# Patient Record
Sex: Female | Born: 1945 | Race: Black or African American | Hispanic: No | Marital: Married | Smoking: Never smoker
Health system: Southern US, Community
[De-identification: ages and names within clinical notes are randomized; demographics above are authoritative.]

## PROBLEM LIST (undated history)

## (undated) DIAGNOSIS — E119 Type 2 diabetes mellitus without complications: Secondary | ICD-10-CM

## (undated) DIAGNOSIS — I10 Essential (primary) hypertension: Secondary | ICD-10-CM

## (undated) DIAGNOSIS — E079 Disorder of thyroid, unspecified: Secondary | ICD-10-CM

## (undated) DIAGNOSIS — K8020 Calculus of gallbladder without cholecystitis without obstruction: Secondary | ICD-10-CM

## (undated) DIAGNOSIS — Z832 Family history of diseases of the blood and blood-forming organs and certain disorders involving the immune mechanism: Secondary | ICD-10-CM

## (undated) DIAGNOSIS — D126 Benign neoplasm of colon, unspecified: Secondary | ICD-10-CM

## (undated) DIAGNOSIS — M79609 Pain in unspecified limb: Secondary | ICD-10-CM

## (undated) DIAGNOSIS — R1310 Dysphagia, unspecified: Secondary | ICD-10-CM

## (undated) DIAGNOSIS — G4700 Insomnia, unspecified: Secondary | ICD-10-CM

## (undated) DIAGNOSIS — E039 Hypothyroidism, unspecified: Secondary | ICD-10-CM

## (undated) DIAGNOSIS — M5430 Sciatica, unspecified side: Secondary | ICD-10-CM

## (undated) HISTORY — DX: Benign neoplasm of colon, unspecified: D12.6

## (undated) HISTORY — DX: Pain in unspecified limb: M79.609

## (undated) HISTORY — DX: Insomnia, unspecified: G47.00

## (undated) HISTORY — PX: TUBAL LIGATION: SHX77

## (undated) HISTORY — DX: Disorder of thyroid, unspecified: E07.9

## (undated) HISTORY — DX: Dysphagia, unspecified: R13.10

## (undated) HISTORY — PX: CHOLECYSTECTOMY: SHX55

## (undated) HISTORY — DX: Sciatica, unspecified side: M54.30

## (undated) HISTORY — DX: Family history of diseases of the blood and blood-forming organs and certain disorders involving the immune mechanism: Z83.2

## (undated) HISTORY — DX: Essential (primary) hypertension: I10

## (undated) HISTORY — PX: COLONOSCOPY: SHX174

---

## 2007-11-24 ENCOUNTER — Encounter: Payer: Self-pay | Admitting: Endocrinology

## 2008-12-02 ENCOUNTER — Encounter: Payer: Self-pay | Admitting: Endocrinology

## 2008-12-06 ENCOUNTER — Encounter: Payer: Self-pay | Admitting: Endocrinology

## 2009-03-09 ENCOUNTER — Encounter: Payer: Self-pay | Admitting: Endocrinology

## 2009-03-11 DIAGNOSIS — R1011 Right upper quadrant pain: Secondary | ICD-10-CM | POA: Insufficient documentation

## 2009-03-11 DIAGNOSIS — J309 Allergic rhinitis, unspecified: Secondary | ICD-10-CM | POA: Insufficient documentation

## 2009-03-11 DIAGNOSIS — E042 Nontoxic multinodular goiter: Secondary | ICD-10-CM | POA: Insufficient documentation

## 2009-03-11 DIAGNOSIS — N3000 Acute cystitis without hematuria: Secondary | ICD-10-CM | POA: Insufficient documentation

## 2009-03-11 DIAGNOSIS — K5730 Diverticulosis of large intestine without perforation or abscess without bleeding: Secondary | ICD-10-CM | POA: Insufficient documentation

## 2009-03-11 DIAGNOSIS — E785 Hyperlipidemia, unspecified: Secondary | ICD-10-CM | POA: Insufficient documentation

## 2009-03-14 ENCOUNTER — Ambulatory Visit: Payer: Self-pay | Admitting: Endocrinology

## 2009-03-14 DIAGNOSIS — E890 Postprocedural hypothyroidism: Secondary | ICD-10-CM | POA: Insufficient documentation

## 2009-03-14 DIAGNOSIS — E119 Type 2 diabetes mellitus without complications: Secondary | ICD-10-CM | POA: Insufficient documentation

## 2009-03-14 DIAGNOSIS — R7309 Other abnormal glucose: Secondary | ICD-10-CM | POA: Insufficient documentation

## 2009-03-14 LAB — CONVERTED CEMR LAB: Hgb A1c MFr Bld: 7.2 % — ABNORMAL HIGH (ref 4.6–6.5)

## 2009-03-18 ENCOUNTER — Telehealth (INDEPENDENT_AMBULATORY_CARE_PROVIDER_SITE_OTHER): Payer: Self-pay | Admitting: *Deleted

## 2009-03-28 ENCOUNTER — Encounter: Admission: RE | Admit: 2009-03-28 | Discharge: 2009-03-28 | Payer: Self-pay | Admitting: Endocrinology

## 2010-02-15 LAB — CONVERTED CEMR LAB

## 2010-02-16 ENCOUNTER — Telehealth: Payer: Self-pay | Admitting: Endocrinology

## 2010-02-16 ENCOUNTER — Ambulatory Visit: Payer: Self-pay | Admitting: Endocrinology

## 2010-02-16 DIAGNOSIS — R519 Headache, unspecified: Secondary | ICD-10-CM | POA: Insufficient documentation

## 2010-02-16 DIAGNOSIS — R51 Headache: Secondary | ICD-10-CM | POA: Insufficient documentation

## 2010-02-16 LAB — CONVERTED CEMR LAB
Hgb A1c MFr Bld: 6.4 % (ref 4.6–6.5)
Sed Rate: 64 mm/hr — ABNORMAL HIGH (ref 0–22)
TSH: 1.65 microintl units/mL (ref 0.35–5.50)

## 2011-01-30 NOTE — Progress Notes (Signed)
  Phone Note Call from Patient Call back at Home Phone [phone]   Caller: Patient/[phone] Call For: dr ellsion Summary of Call: per Narda Bonds call stated Dr Lorane Gell stated she can have a rx for metformin , would like that rx.[facility] KitchenMar[facility] KitchenMar[id number] ([facility]) [address], [place], [place]  [id number] Ph: [phone] Fax: [phone] Initial call taken by: Shelbie Proctor,  March 18, 2009 9:41 AM  Follow-up for Phone Call        sent Follow-up by: Minus Breeding MD,  March 18, 2009 12:44 PM  Additional Follow-up for Phone Call Additional follow up Details #1::        called pt to inform pt was informed and made aware Additional Follow-up by: Shelbie Proctor,  March 18, 2009 1:19 PM    New/Updated Medications: METFORMIN HCL 500 MG XR24H-TAB (METFORMIN HCL) 1 qam   Prescriptions: METFORMIN HCL 500 MG XR24H-TAB (METFORMIN HCL) 1 qam  #30 x 11   Entered and Authorized by:   Minus Breeding MD   Signed by:   Minus Breeding MD on 03/18/2009   Method used:   Electronically to        [facility]. #[id number]* (retail)       [address] [place], [place]  [id number]       Ph: [phone]       Fax: [phone]   RxID:   [id number]

## 2011-01-30 NOTE — Letter (Signed)
Summary: [facility]   Imported By: Sherian Rein 03/21/2009 10:31:06  _____________________________________________________________________  External Attachment:    Type:   Image     Comment:   External Document

## 2011-01-30 NOTE — Progress Notes (Signed)
----   Converted from flag ---- ---- 02/16/2010 3:07 PM, Irma Newness wrote: Patient needs a prescription of the Metformin sent to [facility] in [place], [place] on [facility]. ------------------------------  Prescriptions: METFORMIN HCL 500 MG XR24H-TAB (METFORMIN HCL) 1 qam  #30 x 11   Entered by:   Margaret Pyle, CMA   Authorized by:   Minus Breeding MD   Signed by:   Margaret Pyle, CMA on 02/16/2010   Method used:   Electronically to        [facility]. #[id number]* (retail)       [address], [place], [place]  [id number]       Ph: [phone]       Fax: [phone]   RxID:   [id number]

## 2011-01-30 NOTE — Assessment & Plan Note (Signed)
Summary: NEW ENDO/ANTHEM/PAPER WORK/$50/PN   Vital Signs:  Patient profile:   65 year old female Height:      65 inches (165.10 cm) Weight:      213.6 pounds (97.09 kg) BMI:     35.67 O2 Sat:      96 % Temp:     98.3 degrees F (36.83 degrees C) oral Pulse rate:   69 / minute BP sitting:   120 / 78  (left arm) Cuff size:   large  Vitals Entered By: Orlan Leavens (March 14, 2009 3:54 PM)  Referring Provider:  Dr. Criss Alvine Primary Provider:  Dr. Lajoyce Lauber   History of Present Illness: pt had i-131 rx for hyperthyroidism approx 10 years ago.  she has been euthyroid on synthroid 100 micrograms once daily. pt was noted to have hyperglycemia on routine labs.  she does not check cbg at home.  she says her diet and exercise are poor. symptomatically, she has few years of slight blurry vision.  Current Medications (verified): 1)  Omega-3 350 Mg Caps (Omega-3 Fatty Acids) .... Take 1 By Mouth Qd 2)  Effexor Xr 75 Mg Xr24h-Cap (Venlafaxine Hcl) .... Take 1 By Mouth Qd 3)  Doxycycline Hyclate 50 Mg Caps (Doxycycline Hyclate) .... Take 1 Two Times A Day 4)  Olux-E 0.05 % Foam (Clobetasol Propionate Emulsion) .... Apply To Scalp Two Times A Day 5)  Synthroid 100 Mcg Tabs (Levothyroxine Sodium) .... Take 1 By Mouth Qd  Allergies (verified): No Known Drug Allergies  Past History:  Past Medical History:    Goiter-Multinodular    Hypothyroidism    Allergic rhinitis    Diverticulosis, colon    Hyperlipidemia     (03/11/2009)  Family History:    mother has uncertain type of thyroid dz    no dm.    sister has sjogren's syndrome.  Social History:    Reviewed history and no changes required:       married       retired Warehouse manager  Review of Systems       denies weight loss, double vision, headache, chest pain, sob, n/v, urinary frequency, cramps, memory loss, hypoglycemia, bruising, and numbness.  she attributes excessivre diaphoresis to menopause.  effexor helps  depression.   Physical Exam  General:  obese.   Head:  head: no deformity eyes: no periorbital swelling, no proptosis external nose and ears are normal mouth: no lesion seen  Neck:  uncertain if i can appreciate any thyroid enlargement or not.  surface is irregular. Lungs:  Clear to auscultation bilaterally. Normal respiratory effort.  Heart:  Regular rate and rhythm without murmurs or gallops noted. Normal S1,S2.   Abdomen:  abdomen is soft, nontender.  no hepatosplenomegaly.   not distended.  no hernia  Msk:  Normal muscle tone and bulk.  Normal posture, no vertebral or CVA tenderness.  No joint swelling.  Extremities:  no deformity.  no ulcer on the feet.  feet are of normal color and temp.  no edema  Neurologic:  cn 2-12 grossly intact.   readily moves all 4's.   sensation is intact to touch on the feet  Skin:  normal texture and temp.  no rash.  not diaphoretic  Cervical Nodes:  No significant adenopathy.  Psych:  Alert and cooperative; normal mood and affect; normal attention span and concentration.   Additional Exam:  outside test results are reviewed:  ogtt, 75 grams fasting=155 1 hr=278 2 hr=248  tsh=0.54  today: HbA1C                [  H]  7.2 %     Impression & Recommendations:  Problem # 1:  GOITER-MULTINODULAR (ICD-241.1) Assessment Unchanged  Problem # 2:  HYPOTHYROIDISM, POST-RADIATION (ICD-244.1) well-replaced  Problem # 3:  HYPERGLYCEMIA (ICD-790.29) Assessment: New  Problem # 4:  blurry vision uncertain etiology  Other Orders: Radiology Referral (Radiology) TLB-A1C / Hgb A1C (Glycohemoglobin) (83036-A1C)   Patient Instructions: 1)  cc dr cousins 2)  we discussed thyroid US.  it would not change rx now, but would tell us what to expect in the future. if this is a multinodular goiter, recurrent endogenous hyperthyroidism would probably redevelop over a period of many years.  this would first be manifested as a reduction of her synthroid  dosage requirement.  pt requests the us--it is ordered 3)  we discussed the importance of diet and exercise therapy and the risk of diabetes 4)  same synthroid for now (100 micrograms/day) 5)  i advised metformin 6)  i'll leave eval of blurry vision to dr Criss Alvine, if he needs any eval 7)  ret 3 mos      Preventive Care Screening     Last colonoscopy done in 2005 @ [address] in [place], [place]

## 2011-01-30 NOTE — Assessment & Plan Note (Signed)
Summary: FU ON DM /NWS  #   Vital Signs:  Patient profile:   65 year old female Height:      65 inches (165.10 cm) Weight:      200.25 pounds (91.02 kg) BMI:     33.44 O2 Sat:      97 % on Room air Temp:     97.0 degrees F (36.11 degrees C) oral Pulse rate:   58 / minute BP sitting:   120 / 78  (left arm) Cuff size:   large  Vitals Entered By: Josph Macho RMA (February 16, 2010 1:36 PM)  O2 Flow:  Room air CC: Follow-up visit/ pt states she is no longer taking Effexor, Doxycycline, or Olux/ CF   Referring Provider:  Dr. Criss Alvine Primary Provider:  Dr. Lajoyce Lauber  CC:  Follow-up visit/ pt states she is no longer taking Effexor, Doxycycline, and or Olux/ CF.  History of Present Illness: no cbg record, but states cbg's vary from 107-120, in am.   she does notice the goiter.   she reports intermittent swallowing problems in the neck.   pt also states an intermittent right parietal headache  Allergies: No Known Drug Allergies  Past History:  Past Medical History: Last updated: 03/11/2009 Goiter-Multinodular Hypothyroidism Allergic rhinitis Diverticulosis, colon Hyperlipidemia  Review of Systems  The patient denies dyspnea on exertion.         denies neck pain.  Physical Exam  General:  normal appearance.   Head:  right parietal area is normal--nontender Neck:  i do not notice a goiter Pulses:  dorsalis pedis intact bilat.   Extremities:  no deformity.  no ulcer on the feet.  feet are of normal color and temp.  no edema  Neurologic:  sensation is intact to touch on the feet  Additional Exam:  FastTSH                   1.65 uIU/mL                 0.35-5.50 Hemoglobin A1C            6.4 %                       4.6-6.5 Sed Rate             [H]  64 mm/hr     Impression & Recommendations:  Problem # 1:  HYPOTHYROIDISM, POST-RADIATION (ICD-244.1) well-replaced  Problem # 2:  DM (ICD-250.00) well-controlled  Problem # 3:  GOITER-MULTINODULAR  (ICD-241.1) too small to cause dysphagia  Problem # 4:  HEADACHE (ICD-784.0) uncertain etiology should be eval in view of elevated esr.  Other Orders: TLB-TSH (Thyroid Stimulating Hormone) (84443-TSH) TLB-A1C / Hgb A1C (Glycohemoglobin) (83036-A1C) TLB-Sedimentation Rate (ESR) (85652-ESR) Est. Patient Level IV (01751)  Patient Instructions: 1)  tests are being ordered for you today.  a few days after the test(s), please call [phone] to hear your test results. 2)  pending the test results, please continue the same medications for now. 3)  you should consider having the swallowing and headache checked as separate problems. 4)  (update: i left message on phone-tree:  see dr Criss Alvine to f/u headache, in view of elevated esr)  Preventive Care Screening  Hemoccult:    Date:  02/15/2010    Results:  historical   Pap Smear:    Date:  02/15/2010    Results:  historical   Mammogram:    Date:  01/31/2010    Results:  historical   Last Flu Shot:    Date:  09/30/2009    Results:  historical

## 2012-01-28 ENCOUNTER — Other Ambulatory Visit: Payer: Self-pay | Admitting: Family Medicine

## 2012-01-28 ENCOUNTER — Ambulatory Visit
Admission: RE | Admit: 2012-01-28 | Discharge: 2012-01-28 | Disposition: A | Discharge status: Home / Self Care | Payer: BC Managed Care – PPO | Source: Ambulatory Visit | Attending: Family Medicine | Admitting: Family Medicine

## 2012-01-28 DIAGNOSIS — R1011 Right upper quadrant pain: Secondary | ICD-10-CM

## 2012-01-29 ENCOUNTER — Other Ambulatory Visit: Payer: Self-pay

## 2012-02-04 ENCOUNTER — Encounter (INDEPENDENT_AMBULATORY_CARE_PROVIDER_SITE_OTHER): Payer: Self-pay | Admitting: Surgery

## 2012-02-19 ENCOUNTER — Ambulatory Visit (INDEPENDENT_AMBULATORY_CARE_PROVIDER_SITE_OTHER): Payer: BC Managed Care – PPO | Admitting: Surgery

## 2012-02-19 ENCOUNTER — Encounter (INDEPENDENT_AMBULATORY_CARE_PROVIDER_SITE_OTHER): Payer: Self-pay | Admitting: Surgery

## 2012-02-19 VITALS — BP 118/74 | HR 56 | Temp 97.5°F | Ht 65.0 in | Wt 194.4 lb

## 2012-02-19 DIAGNOSIS — K8020 Calculus of gallbladder without cholecystitis without obstruction: Secondary | ICD-10-CM | POA: Insufficient documentation

## 2012-02-19 NOTE — Progress Notes (Signed)
Patient ID: Kristy Fuller, female   DOB: 1946/06/12, 66 y.o.   MRN: [id number]  Chief Complaint  Patient presents with  . Other    symptomatic cholelithiasis    HPI Kristy Fuller is a 65 y.o. female.   HPIThis is a pleasant female referred by Dr. Warrick Parisian for evaluation of right upper quadrant abdominal pain. The patient reports that she has had right upper quadrant abdominal pain which she described as sharp for many years. It is now getting worse and happens almost daily. It does not refer any where else. She has no nausea or vomiting. The pain is moderate in intensity. She cannot relate it to anything she eats. She denies any jaundice. She has no other complaints.  Past Medical History  Diagnosis Date  . Diabetes mellitus   . Thyroid disease   . Hypertension   . Benign neoplasm of colon   . Insomnia, unspecified   . Pain in limb   . Dysphagia, unspecified   . Sciatica     History reviewed. No pertinent past surgical history.  History reviewed. No pertinent family history.  Social History History  Substance Use Topics  . Smoking status: Never Smoker   . Smokeless tobacco: Not on file  . Alcohol Use: No    No Known Allergies  Current Outpatient Prescriptions  Medication Sig Dispense Refill  . aspirin 81 MG tablet Take 81 mg by mouth daily.      . Biotin 5000 MCG CAPS Take 5,000 mcg by mouth.      . calcium & magnesium carbonates (MYLANTA) 311-232 MG per tablet Take 1 tablet by mouth 2 (two) times daily.      . Cholecalciferol (D-3-5) 5000 UNITS capsule Take 10,000 Units by mouth daily.      Marland Kitchen co-enzyme Q-10 30 MG capsule Take 200 mg by mouth daily.      . fish oil-omega-3 fatty acids 1000 MG capsule Take 2 g by mouth daily.      Marland Kitchen glucosamine-chondroitin 500-400 MG tablet Take 1 tablet by mouth 3 (three) times daily.      Boris Lown Oil 500 MG CAPS Take 500 mg by mouth daily.      Marland Kitchen levothyroxine (SYNTHROID, LEVOTHROID) 100 MCG tablet Take by mouth daily.       Marland Kitchen losartan (COZAAR) 50 MG tablet Take 50 mg by mouth daily.      . metFORMIN (GLUCOPHAGE-XR) 750 MG 24 hr tablet Take 750 mg by mouth daily with breakfast.      . NON FORMULARY 400 mg daily. Tumeric      . vitamin B-12 (CYANOCOBALAMIN) 1000 MCG tablet Take 1,000 mcg by mouth daily.        Review of Systems Review of Systems  Constitutional: Negative for fever, chills and unexpected weight change.  HENT: Negative for hearing loss, congestion, sore throat, trouble swallowing and voice change.   Eyes: Negative for visual disturbance.  Respiratory: Negative for cough and wheezing.   Cardiovascular: Negative for chest pain, palpitations and leg swelling.  Gastrointestinal: Positive for abdominal pain. Negative for nausea, vomiting, diarrhea, constipation, blood in stool, abdominal distention and anal bleeding.  Genitourinary: Negative for hematuria, vaginal bleeding and difficulty urinating.  Musculoskeletal: Negative for arthralgias.  Skin: Negative for rash and wound.  Neurological: Negative for seizures, syncope and headaches.  Hematological: Negative for adenopathy. Does not bruise/bleed easily.  Psychiatric/Behavioral: Negative for confusion.    Blood pressure 118/74, pulse 56, temperature 97.5 F (36.4 C), temperature  source Temporal, height 5\' 5"  (1.651 m), weight 194 lb 6.4 oz (88.179 kg), SpO2 97.00%.  Physical Exam Physical Exam  Constitutional: She is oriented to person, place, and time. She appears well-developed and well-nourished. No distress.  HENT:  Head: Normocephalic and atraumatic.  Right Ear: External ear normal.  Left Ear: External ear normal.  Nose: Nose normal.  Mouth/Throat: Oropharynx is clear and moist. No oropharyngeal exudate.  Eyes: Conjunctivae are normal. Pupils are equal, round, and reactive to light. Right eye exhibits no discharge. Left eye exhibits no discharge. No scleral icterus.  Neck: Normal range of motion. No tracheal deviation present. No  thyromegaly present.  Cardiovascular: Normal rate, regular rhythm, normal heart sounds and intact distal pulses.   No murmur heard. Pulmonary/Chest: Effort normal and breath sounds normal. No respiratory distress. She has no wheezes. She has no rales.  Abdominal: Soft. Bowel sounds are normal. She exhibits no distension. There is no tenderness. There is no rebound and no guarding.  Musculoskeletal: Normal range of motion. She exhibits no edema and no tenderness.  Lymphadenopathy:    She has no cervical adenopathy.  Neurological: She is alert and oriented to person, place, and time.  Skin: Skin is warm and dry. No rash noted. She is not diaphoretic. No erythema.  Psychiatric: Her behavior is normal. Judgment normal.    Data Reviewed I have the notes from Dr. Creta Levin office which I have reviewed. Her liver function tests are normal. Her ultrasound shows cholelithiasis. There is no gallbladder wall thickening and the bile duct is normal  Assessment    Symptomatic cholelithiasis    Plan   I do believe the pain in her right upper quadrant is gallbladder related. I recommend laparoscopic cholecystectomy with possible cholangiogram. I gave her literature regarding this and discussed the surgery with her and her husband. I discussed the risk which includes but is not limited to bleeding, infection, bile duct injury, bile leak, injury to other structures, need to convert to an open procedure, the chances may not resolve all her symptoms, etc. She is going to think about it and call back whether or not her she will to schedule surgery .       Yoshua Geisinger A 02/19/2012, 9:52 AM

## 2012-02-26 ENCOUNTER — Telehealth (INDEPENDENT_AMBULATORY_CARE_PROVIDER_SITE_OTHER): Payer: Self-pay | Admitting: General Surgery

## 2012-02-26 ENCOUNTER — Other Ambulatory Visit (INDEPENDENT_AMBULATORY_CARE_PROVIDER_SITE_OTHER): Payer: Self-pay | Admitting: Surgery

## 2012-02-26 DIAGNOSIS — D689 Coagulation defect, unspecified: Secondary | ICD-10-CM

## 2012-02-26 NOTE — Telephone Encounter (Signed)
Called pt to let her know that I sent a referral to the Hematologists and that they will be giving her a call

## 2012-03-04 ENCOUNTER — Telehealth: Payer: Self-pay | Admitting: Family Medicine

## 2012-03-04 NOTE — Telephone Encounter (Signed)
Called pt, left message, for pt to call us back

## 2012-03-06 ENCOUNTER — Telehealth: Payer: Self-pay | Admitting: Oncology

## 2012-03-06 NOTE — Telephone Encounter (Signed)
Talked to pt's husband,gave him appt date , time and location

## 2012-03-10 ENCOUNTER — Telehealth: Payer: Self-pay | Admitting: Oncology

## 2012-03-10 NOTE — Telephone Encounter (Signed)
Referred by Dr. Magnus Ivan Dx- R/O Clotting Disorder

## 2012-03-17 ENCOUNTER — Encounter: Payer: Self-pay | Admitting: Oncology

## 2012-03-17 DIAGNOSIS — Z832 Family history of diseases of the blood and blood-forming organs and certain disorders involving the immune mechanism: Secondary | ICD-10-CM

## 2012-03-17 HISTORY — DX: Family history of diseases of the blood and blood-forming organs and certain disorders involving the immune mechanism: Z83.2

## 2012-03-19 ENCOUNTER — Other Ambulatory Visit (HOSPITAL_BASED_OUTPATIENT_CLINIC_OR_DEPARTMENT_OTHER): Payer: BC Managed Care – PPO | Admitting: Lab

## 2012-03-19 ENCOUNTER — Other Ambulatory Visit: Payer: Self-pay | Admitting: Oncology

## 2012-03-19 ENCOUNTER — Encounter (HOSPITAL_COMMUNITY)
Admission: RE | Admit: 2012-03-19 | Discharge: 2012-03-19 | Disposition: A | Discharge status: Home / Self Care | Payer: BC Managed Care – PPO | Source: Ambulatory Visit | Attending: Oncology | Admitting: Oncology

## 2012-03-19 ENCOUNTER — Ambulatory Visit (HOSPITAL_BASED_OUTPATIENT_CLINIC_OR_DEPARTMENT_OTHER): Payer: BC Managed Care – PPO | Admitting: Oncology

## 2012-03-19 ENCOUNTER — Ambulatory Visit: Payer: BC Managed Care – PPO

## 2012-03-19 VITALS — BP 130/68 | HR 48 | Temp 97.8°F | Ht 65.25 in | Wt 194.8 lb

## 2012-03-19 DIAGNOSIS — Z832 Family history of diseases of the blood and blood-forming organs and certain disorders involving the immune mechanism: Secondary | ICD-10-CM

## 2012-03-19 DIAGNOSIS — K8020 Calculus of gallbladder without cholecystitis without obstruction: Secondary | ICD-10-CM

## 2012-03-19 DIAGNOSIS — D649 Anemia, unspecified: Secondary | ICD-10-CM | POA: Insufficient documentation

## 2012-03-19 LAB — CBC WITH DIFFERENTIAL/PLATELET
BASO%: 0.4 % (ref 0.0–2.0)
Basophils Absolute: 0 10*3/uL (ref 0.0–0.1)
EOS%: 1.5 % (ref 0.0–7.0)
Eosinophils Absolute: 0.1 10*3/uL (ref 0.0–0.5)
HCT: 35.8 % (ref 34.8–46.6)
HGB: 12.2 g/dL (ref 11.6–15.9)
LYMPH%: 25.6 % (ref 14.0–49.7)
MCH: 31.1 pg (ref 25.1–34.0)
MCHC: 34.1 g/dL (ref 31.5–36.0)
MCV: 91.3 fL (ref 79.5–101.0)
MONO#: 0.4 10*3/uL (ref 0.1–0.9)
MONO%: 7.3 % (ref 0.0–14.0)
NEUT#: 4 10*3/uL (ref 1.5–6.5)
NEUT%: 65.2 % (ref 38.4–76.8)
Platelets: 198 10*3/uL (ref 145–400)
RBC: 3.93 10*6/uL (ref 3.70–5.45)
RDW: 14.3 % (ref 11.2–14.5)
WBC: 6.1 10*3/uL (ref 3.9–10.3)
lymph#: 1.6 10*3/uL (ref 0.9–3.3)

## 2012-03-19 LAB — MORPHOLOGY
PLT EST: ADEQUATE
RBC Comments: NORMAL

## 2012-03-19 LAB — APTT: aPTT: 37 seconds (ref 24–37)

## 2012-03-19 LAB — CHCC SMEAR

## 2012-03-19 LAB — PROTHROMBIN TIME
INR: 0.96 (ref <1.50)
Prothrombin Time: 13 seconds (ref 11.6–15.2)

## 2012-03-19 LAB — ABO/RH: ABO/RH(D): O NEG

## 2012-03-19 LAB — HOLD TUBE, BLOOD BANK

## 2012-03-19 NOTE — Progress Notes (Signed)
New Patient Hematology-Oncology Evaluation   Kristy Fuller [id number] 1946-03-29 66 y.o. 03/19/2012  CC: Dr. Abigail Miyamoto; Dr. Warrick Parisian [facility] in [place]   Reason for referral: Possible family history of a clotting disorder in a woman who is being considered for gallbladder surgery   HPI:  This is a pleasant 66 year old woman with hypertension over the last 2 years, type 2 diabetes on oral agent for the last 3 years, and hypothyroid on replacement for over 15 years. She has never had any prior surgical procedures. She has had a 5-6 month history of right upper quadrant fullness with intermittent low level pain and intolerance to certain foods particularly nuts and bread but not fried foods. She gets a feeling like there is a knot in the right upper quadrant area. She has not noticed any acholic stools or dark urine. She has normal liver functions. Ultrasound of the gallbladder showed gallstones, wall thickening, no evidence for obstruction.  Her daughter who is now 25 years old was evaluated in the past for recurrent miscarriage and was found to be a homozygote for the MTHFR gene. Although this genetic abnormality  was felt to result in an increased clotting risk in homozygotes, further study shows that it is not a risk factor for either recurrent miscarriage or thrombosis. The patient's daughter and nobody else in the family including her 2 brothers aged 90 and 67, Mrs. Appleby, her 3 sisters and 3 brothers, have ever had problems with blood clots. One sister has lupus and one sister was diagnosed with Sjogren's syndrome. Her mother  has hypothyroidism and her father died of lung cancer related to cigarette smoking.   PMH: Past Medical History  Diagnosis Date  . Diabetes mellitus   . Thyroid disease   . Hypertension   . Benign neoplasm of colon   . Insomnia, unspecified   . Pain in limb   . Dysphagia, unspecified   . Sciatica   . Family history of  bleeding disorder 03/17/2012   no history of hepatitis yellow jaundice or malaria.   past surgical history: No prior surgery  Allergies: No Known Allergies  Medications: Medications Prior to Admission  Medication Sig Dispense Refill  . aspirin 81 MG tablet Take 81 mg by mouth daily.      . Biotin 5000 MCG CAPS Take 5,000 mcg by mouth.      . calcium & magnesium carbonates (MYLANTA) 311-232 MG per tablet Take 1 tablet by mouth 2 (two) times daily.      . Cholecalciferol (D-3-5) 5000 UNITS capsule Take 10,000 Units by mouth daily.      Marland Kitchen co-enzyme Q-10 30 MG capsule Take 200 mg by mouth daily.      . fish oil-omega-3 fatty acids 1000 MG capsule Take 2 g by mouth daily.      Marland Kitchen glucosamine-chondroitin 500-400 MG tablet Take 1 tablet by mouth 3 (three) times daily.      Boris Lown Oil 500 MG CAPS Take 500 mg by mouth daily.      Marland Kitchen levothyroxine (SYNTHROID, LEVOTHROID) 100 MCG tablet Take by mouth daily.      Marland Kitchen losartan (COZAAR) 50 MG tablet Take 50 mg by mouth daily.      . metFORMIN (GLUCOPHAGE-XR) 750 MG 24 hr tablet Take 750 mg by mouth daily with breakfast.      . NON FORMULARY 400 mg daily. Tumeric      . vitamin B-12 (CYANOCOBALAMIN) 1000 MCG tablet Take 1,000 mcg by  mouth daily.       No current facility-administered medications on file as of 03/19/2012.    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  she has never smoked. She does not have any smokeless tobacco history on file. she does not drink alcohol or use illicit drugs. Her husband is a Optician, dispensing and he accompanies her today.  Family History: See above  Review of Systems: Constitutional symptoms: No constitutional symptoms; she has not been running any fevers when she gets flare up of her right upper quadrant symptoms HEENT: Respiratory: No cough or dyspnea Cardiovascular:  No chest pain or palpitation Gastrointestinal ROS: See above Genito-Urinary ROS: No urinary tract  symptoms  Hematological and Lymphatic: Musculoskeletal: No bone pain Neurologic: No headache or change in vision Dermatologic: No skin rash Remaining ROS negative.  Physical Exam: Blood pressure 130/68, pulse 48, temperature 97.8 F (36.6 C), temperature source Oral, height 5' 5.25" (1.657 m), weight 194 lb 12.8 oz (88.361 kg). Wt Readings from Last 3 Encounters:  03/19/12 194 lb 12.8 oz (88.361 kg)  02/19/12 194 lb 6.4 oz (88.179 kg)  02/16/10 200 lb 4 oz (90.833 kg)    General appearance: Well-nourished African American woman Head: Normal Neck: Normal Lymph nodes: No adenopathy Breasts: Not examined Lungs: Clear to auscultation resonant to percussion Heart: Regular rhythm no murmur Abdominal: Soft nontender no mass no organomegaly GU: Not examined Extremities: No edema or calf tenderness Neurologic: Pupils equal round reactive to light optic disc sharp, cranial nerves grossly normal, motor strength 5 over 5, reflexes 1+ symmetric, sensation intact to vibration over the fingertips. 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 No contraindications to surgery with the usual perioperative thromboprophylaxis.  She is apprehensive about rushing into surgery. She would like to see a gastroenterologist to discuss medical therapy to dissolve her gallstones. Her family physician can arrange this.      Levert Feinstein, MD 03/19/2012, 5:06 PM

## 2012-05-08 ENCOUNTER — Emergency Department (HOSPITAL_COMMUNITY)
Admission: EM | Admit: 2012-05-08 | Discharge: 2012-05-08 | Disposition: A | Discharge status: Home / Self Care | Payer: Medicare Other | Attending: Emergency Medicine | Admitting: Emergency Medicine

## 2012-05-08 ENCOUNTER — Encounter (HOSPITAL_COMMUNITY): Payer: Self-pay | Admitting: Emergency Medicine

## 2012-05-08 DIAGNOSIS — E119 Type 2 diabetes mellitus without complications: Secondary | ICD-10-CM | POA: Insufficient documentation

## 2012-05-08 DIAGNOSIS — Z79899 Other long term (current) drug therapy: Secondary | ICD-10-CM | POA: Insufficient documentation

## 2012-05-08 DIAGNOSIS — I10 Essential (primary) hypertension: Secondary | ICD-10-CM | POA: Insufficient documentation

## 2012-05-08 DIAGNOSIS — E039 Hypothyroidism, unspecified: Secondary | ICD-10-CM | POA: Insufficient documentation

## 2012-05-08 DIAGNOSIS — N390 Urinary tract infection, site not specified: Secondary | ICD-10-CM | POA: Insufficient documentation

## 2012-05-08 DIAGNOSIS — E079 Disorder of thyroid, unspecified: Secondary | ICD-10-CM | POA: Insufficient documentation

## 2012-05-08 HISTORY — DX: Hypothyroidism, unspecified: E03.9

## 2012-05-08 HISTORY — DX: Calculus of gallbladder without cholecystitis without obstruction: K80.20

## 2012-05-08 LAB — COMPREHENSIVE METABOLIC PANEL
ALT: 9 U/L (ref 0–35)
AST: 13 U/L (ref 0–37)
Albumin: 4 g/dL (ref 3.5–5.2)
Alkaline Phosphatase: 124 U/L — ABNORMAL HIGH (ref 39–117)
BUN: 10 mg/dL (ref 6–23)
CO2: 26 mEq/L (ref 19–32)
Calcium: 9.6 mg/dL (ref 8.4–10.5)
Chloride: 101 mEq/L (ref 96–112)
Creatinine, Ser: 0.9 mg/dL (ref 0.50–1.10)
GFR calc Af Amer: 77 mL/min — ABNORMAL LOW (ref >90)
GFR calc non Af Amer: 66 mL/min — ABNORMAL LOW (ref >90)
Glucose, Bld: 185 mg/dL — ABNORMAL HIGH (ref 70–99)
Potassium: 3.8 mEq/L (ref 3.5–5.1)
Sodium: 139 mEq/L (ref 135–145)
Total Bilirubin: 0.2 mg/dL — ABNORMAL LOW (ref 0.3–1.2)
Total Protein: 7.7 g/dL (ref 6.0–8.3)

## 2012-05-08 LAB — URINALYSIS, ROUTINE W REFLEX MICROSCOPIC
Bilirubin Urine: NEGATIVE
Glucose, UA: NEGATIVE mg/dL
Ketones, ur: NEGATIVE mg/dL
Nitrite: NEGATIVE
Protein, ur: NEGATIVE mg/dL
Specific Gravity, Urine: 1.018 (ref 1.005–1.030)
Urobilinogen, UA: 1 mg/dL (ref 0.0–1.0)
pH: 6.5 (ref 5.0–8.0)

## 2012-05-08 LAB — URINE MICROSCOPIC-ADD ON

## 2012-05-08 LAB — DIFFERENTIAL
Basophils Absolute: 0 10*3/uL (ref 0.0–0.1)
Basophils Relative: 1 % (ref 0–1)
Eosinophils Absolute: 0.3 10*3/uL (ref 0.0–0.7)
Eosinophils Relative: 4 % (ref 0–5)
Lymphocytes Relative: 27 % (ref 12–46)
Lymphs Abs: 1.8 10*3/uL (ref 0.7–4.0)
Monocytes Absolute: 0.5 10*3/uL (ref 0.1–1.0)
Monocytes Relative: 8 % (ref 3–12)
Neutro Abs: 3.9 10*3/uL (ref 1.7–7.7)
Neutrophils Relative %: 60 % (ref 43–77)

## 2012-05-08 LAB — CBC
HCT: 36.9 % (ref 36.0–46.0)
Hemoglobin: 12.2 g/dL (ref 12.0–15.0)
MCH: 30.4 pg (ref 26.0–34.0)
MCHC: 33.1 g/dL (ref 30.0–36.0)
MCV: 92 fL (ref 78.0–100.0)
Platelets: 212 10*3/uL (ref 150–400)
RBC: 4.01 MIL/uL (ref 3.87–5.11)
RDW: 13.9 % (ref 11.5–15.5)
WBC: 6.5 10*3/uL (ref 4.0–10.5)

## 2012-05-08 LAB — LIPASE, BLOOD: Lipase: 34 U/L (ref 11–59)

## 2012-05-08 MED ORDER — CEPHALEXIN 250 MG PO CAPS
500.0000 mg | ORAL_CAPSULE | Freq: Once | ORAL | Status: AC
  Administered 2012-05-08: 500 mg via ORAL
  Filled 2012-05-08: qty 2

## 2012-05-08 MED ORDER — CEPHALEXIN 500 MG PO CAPS: 500.0000 mg | ORAL_CAPSULE | Freq: Four times a day (QID) | ORAL | Status: AC

## 2012-05-08 MED ORDER — HYDROCODONE-ACETAMINOPHEN 5-325 MG PO TABS: 1.0000 | ORAL_TABLET | Freq: Four times a day (QID) | ORAL | Status: AC | PRN

## 2012-05-08 NOTE — Discharge Instructions (Signed)
Follow up with your md in 1-2 weeks to make sure the urinary infection has cleared

## 2012-05-08 NOTE — ED Provider Notes (Cosign Needed)
History     CSN: [id number]  Arrival date & time 05/08/12  2030   First MD Initiated Contact with Patient 05/08/12 2150      Chief Complaint  Patient presents with  . Abdominal Pain    (Consider location/radiation/quality/duration/timing/severity/associated sxs/prior treatment) Patient is a 66 y.o. female presenting with abdominal pain. The history is provided by the patient (the pt complains of right flank pain.  improved now.  pt has a hx of gall stone).  Abdominal Pain The primary symptoms of the illness include abdominal pain. The primary symptoms of the illness do not include fatigue or diarrhea. The current episode started 3 to 5 hours ago. The onset of the illness was sudden. The problem has been resolved.  Associated with: nothing. The patient states that she believes she is currently not pregnant. The patient has not had a change in bowel habit. Symptoms associated with the illness do not include hematuria, frequency or back pain. Significant associated medical issues do not include PUD.    Past Medical History  Diagnosis Date  . Diabetes mellitus   . Thyroid disease   . Hypertension   . Benign neoplasm of colon   . Insomnia, unspecified   . Pain in limb   . Dysphagia, unspecified   . Sciatica   . Family history of bleeding disorder 03/17/2012  . Gallstones   . Hypothyroidism     History reviewed. No pertinent past surgical history.  No family history on file.  History  Substance Use Topics  . Smoking status: Never Smoker   . Smokeless tobacco: Not on file  . Alcohol Use: No    OB History    Grav Para Term Preterm Abortions TAB SAB Ect Mult Living                  Review of Systems  Constitutional: Negative for fatigue.  HENT: Negative for congestion, sinus pressure and ear discharge.   Eyes: Negative for discharge.  Respiratory: Negative for cough.   Cardiovascular: Negative for chest pain.  Gastrointestinal: Positive for abdominal pain. Negative for  diarrhea.  Genitourinary: Negative for frequency and hematuria.  Musculoskeletal: Negative for back pain.  Skin: Negative for rash.  Neurological: Negative for seizures and headaches.  Hematological: Negative.   Psychiatric/Behavioral: Negative for hallucinations.    Allergies  Review of patient's allergies indicates no known allergies.  Home Medications   Current Outpatient Rx  Name Route Sig Dispense Refill  . ASPIRIN 81 MG PO TABS Oral Take 81 mg by mouth daily.    Marland Kitchen BIOTIN 5000 MCG PO CAPS Oral Take 5,000 mcg by mouth.    . CHOLECALCIFEROL 5000 UNITS PO CAPS Oral Take 10,000 Units by mouth daily.    Marland Kitchen COENZYME Q10 30 MG PO CAPS Oral Take 200 mg by mouth daily.    . OMEGA-3 FATTY ACIDS 1000 MG PO CAPS Oral Take 2 g by mouth daily.    Marland Kitchen GLUCOSAMINE-CHONDROITIN 500-400 MG PO TABS Oral Take 1 tablet by mouth 3 (three) times daily.    Marland Kitchen KRILL OIL 500 MG PO CAPS Oral Take 500 mg by mouth daily.    Marland Kitchen LEVOTHYROXINE SODIUM 100 MCG PO TABS Oral Take by mouth daily.    Marland Kitchen LOSARTAN POTASSIUM 50 MG PO TABS Oral Take 50 mg by mouth daily.    Marland Kitchen METFORMIN HCL ER 750 MG PO TB24 Oral Take 750 mg by mouth daily with breakfast.    . NON FORMULARY  400 mg daily.  Tumeric    . VITAMIN B-12 1000 MCG PO TABS Oral Take 1,000 mcg by mouth daily.    . CEPHALEXIN 500 MG PO CAPS Oral Take 1 capsule (500 mg total) by mouth 4 (four) times daily. 28 capsule 0  . HYDROCODONE-ACETAMINOPHEN 5-325 MG PO TABS Oral Take 1 tablet by mouth every 6 (six) hours as needed for pain. 15 tablet 0    BP 119/73  Pulse 74  Temp(Src) 98.3 F (36.8 C) (Oral)  Resp 18  SpO2 99%  Physical Exam  Constitutional: She is oriented to person, place, and time. She appears well-developed.  HENT:  Head: Normocephalic and atraumatic.  Eyes: Conjunctivae and EOM are normal. No scleral icterus.  Neck: Neck supple. No thyromegaly present.  Cardiovascular: Normal rate and regular rhythm.  Exam reveals no gallop and no friction rub.     No murmur heard. Pulmonary/Chest: No stridor. She has no wheezes. She has no rales. She exhibits no tenderness.  Abdominal: She exhibits no distension. There is no tenderness. There is no rebound.  Musculoskeletal: Normal range of motion. She exhibits no edema.  Lymphadenopathy:    She has no cervical adenopathy.  Neurological: She is oriented to person, place, and time. Coordination normal.  Skin: No rash noted. No erythema.  Psychiatric: She has a normal mood and affect. Her behavior is normal.    ED Course  Procedures (including critical care time)  Labs Reviewed  URINALYSIS, ROUTINE W REFLEX MICROSCOPIC - Abnormal; Notable for the following:    APPearance CLOUDY (*)    Hgb urine dipstick MODERATE (*)    Leukocytes, UA LARGE (*)    All other components within normal limits  COMPREHENSIVE METABOLIC PANEL - Abnormal; Notable for the following:    Glucose, Bld 185 (*)    Alkaline Phosphatase 124 (*)    Total Bilirubin 0.2 (*)    GFR calc non Af Amer 66 (*)    GFR calc Af Amer 77 (*)    All other components within normal limits  URINE MICROSCOPIC-ADD ON - Abnormal; Notable for the following:    Squamous Epithelial / LPF FEW (*)    Bacteria, UA FEW (*)    All other components within normal limits  CBC  DIFFERENTIAL  LIPASE, BLOOD  URINE CULTURE   No results found.   1. UTI (lower urinary tract infection)       MDM          Benny Lennert, MD 05/08/12 [id number]

## 2012-05-08 NOTE — ED Notes (Signed)
PT. REPORTS INTERMITTENT RUQ PAIN WITH NAUSEA FOR SEVERAL DAYS WORSE TODAY , STATES HISTORY OF GALLSTONES , DENIES FEVER OR CHILLS.

## 2012-05-22 ENCOUNTER — Encounter (INDEPENDENT_AMBULATORY_CARE_PROVIDER_SITE_OTHER): Payer: Self-pay

## 2013-09-07 IMAGING — US US ABDOMEN LIMITED
1 series · 14 of 25 positions shown · non-contrast
Comparison: None.

CLINICAL DATA: Right upper quadrant abdominal pain.

LIMITED ABDOMINAL ULTRASOUND - RIGHT UPPER QUADRANT

[Series 1: us abdomen limited · 0.24mm/px · 14 of 43 slices shown]
[im 1/43]
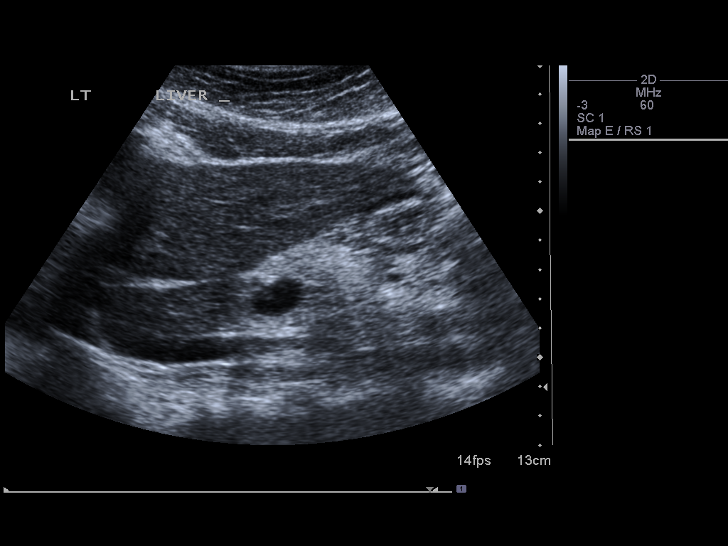
[im 4/43]
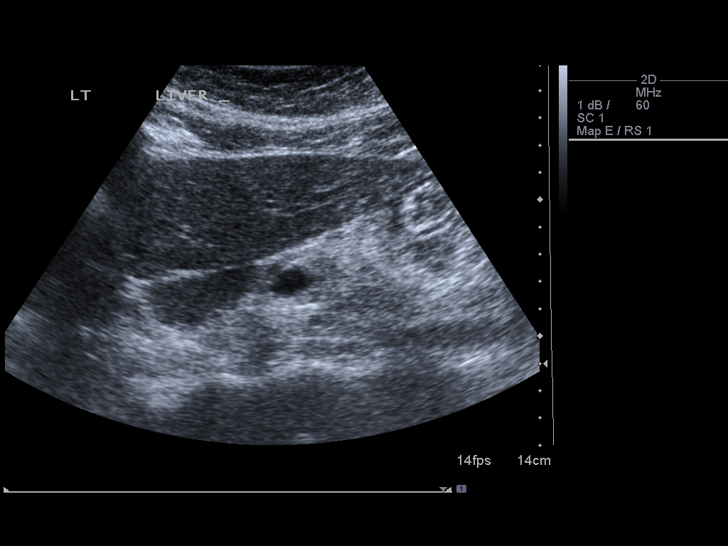
[im 8/43]
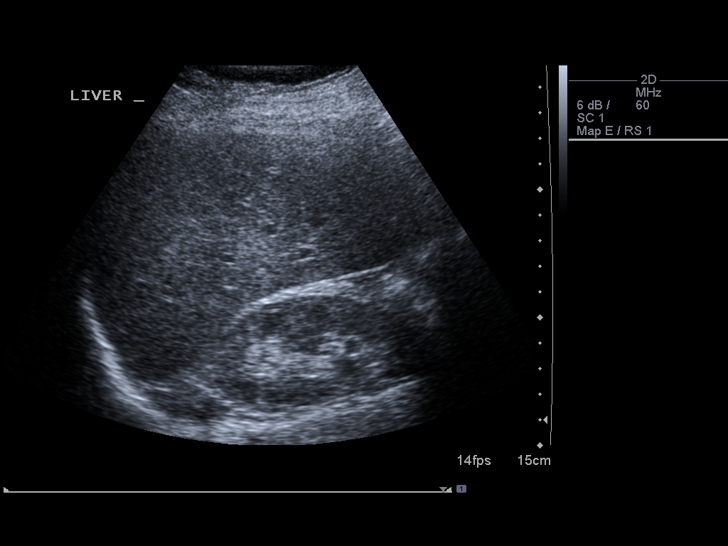
[im 11/43]
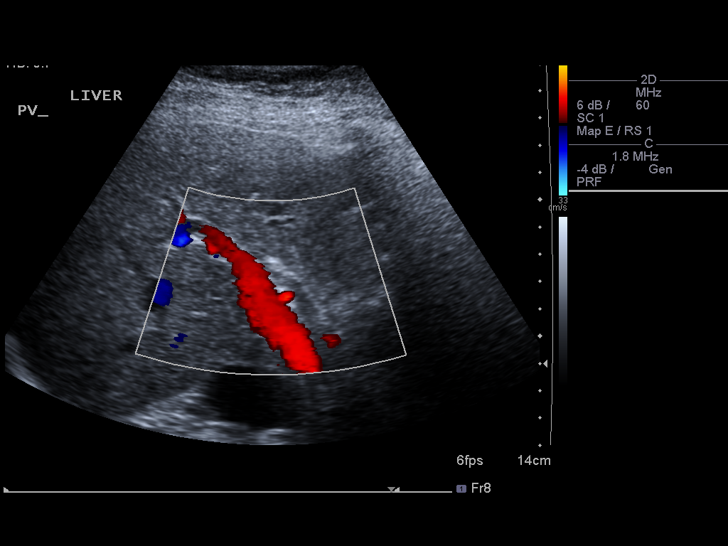
[im 15/43]
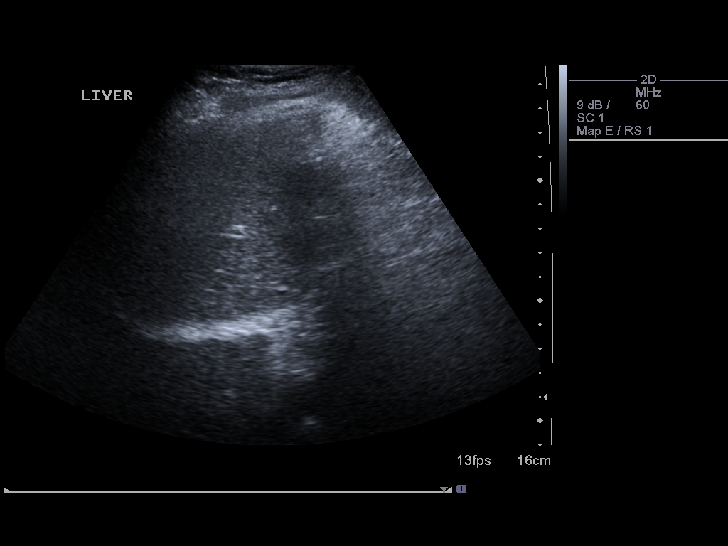
[im 16/43]
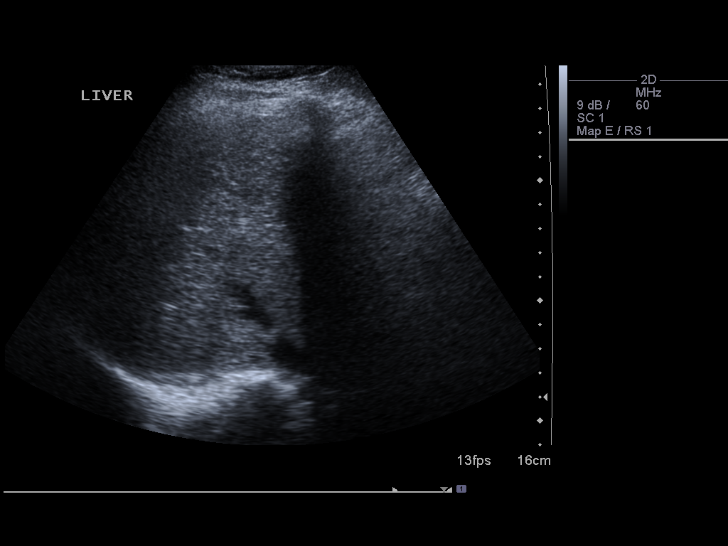
[im 20/43]
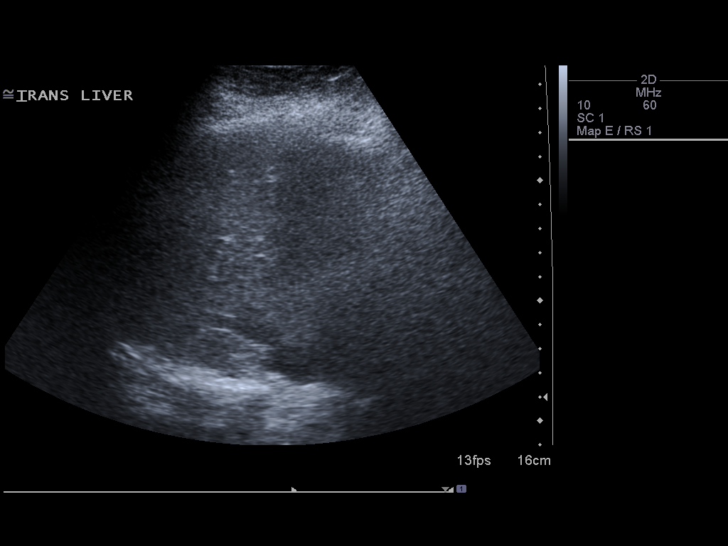
[im 23/43]
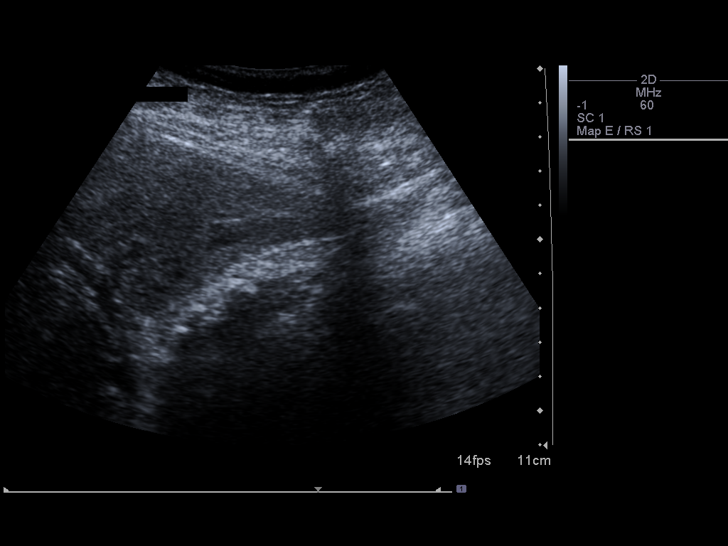
[im 27/43]
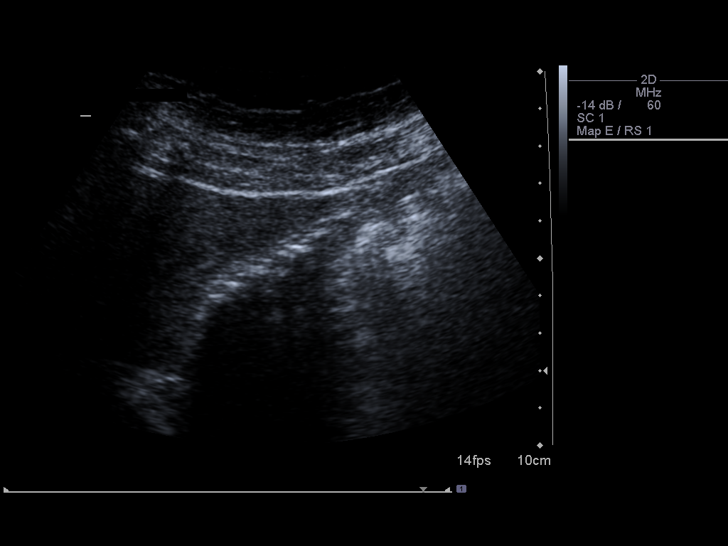
[im 29/43]
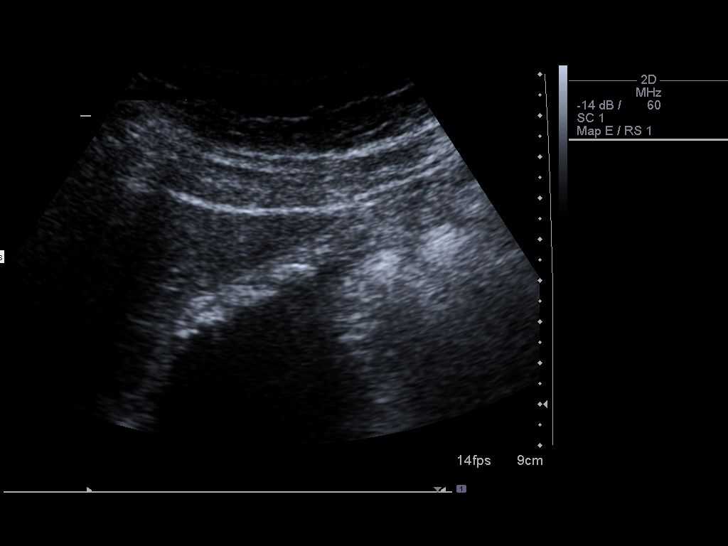
[im 32/43]
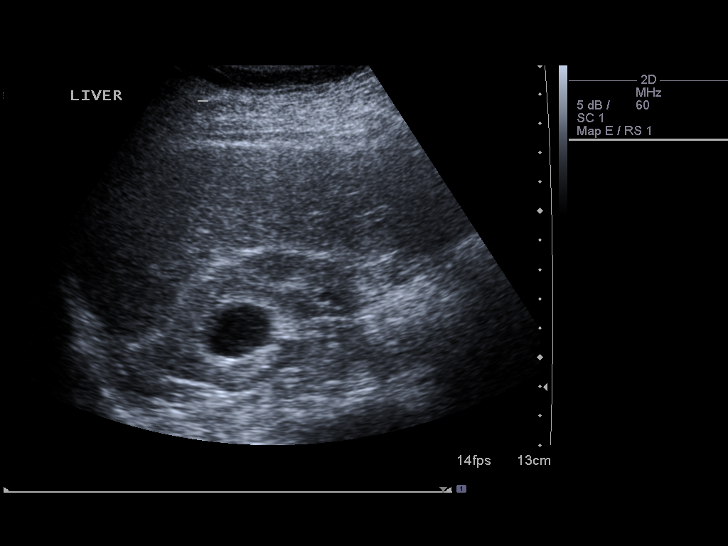
[im 36/43]
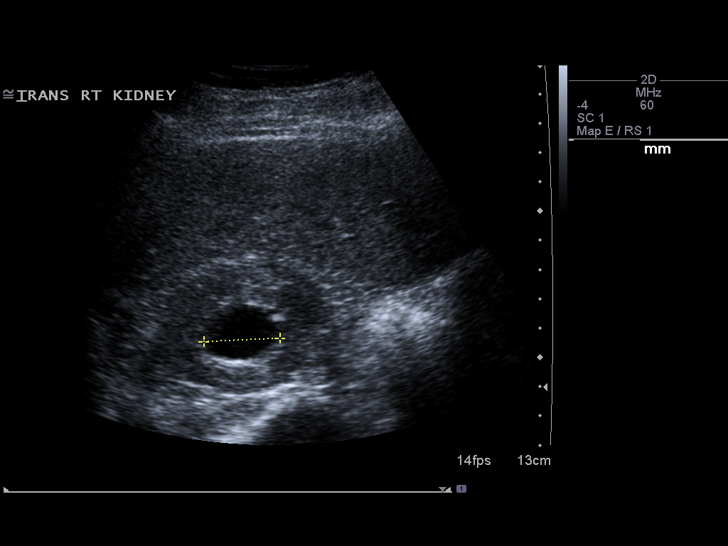
[im 39/43]
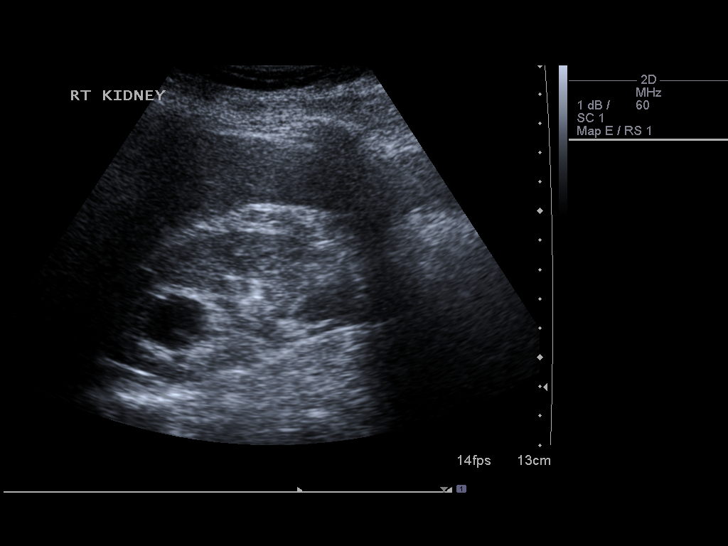
[im 43/43]
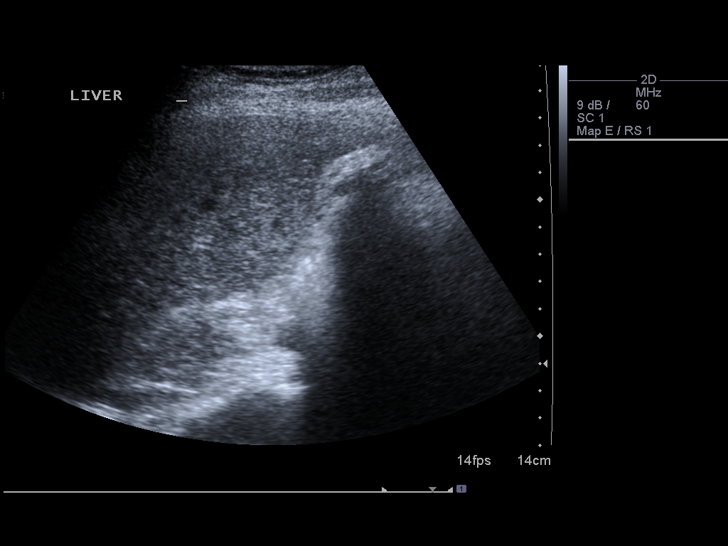

[14 of 25 positions shown; findings below may reference images not displayed]

FINDINGS: Gallbladder:  Numerous small gallstones are seen filling the
gallbladder lumen.  Gallbladder appears nondilated, there is no
evidence of gallbladder wall thickening or pericholecystic fluid.

Common bile duct:  Within normal limits in caliber. Measures 4 mm
in diameter.

Liver:  No focal lesion identified.  Within normal limits in
parenchymal echogenicity. Right upper pole renal cyst incidentally
noted measuring 2.6 cm.
IMPRESSION: 1.  Cholelithiasis.
2.  No evidence of acute cholecystitis or biliary dilatation.

## 2014-11-23 ENCOUNTER — Other Ambulatory Visit: Payer: Self-pay | Admitting: Nephrology

## 2014-11-23 DIAGNOSIS — R319 Hematuria, unspecified: Secondary | ICD-10-CM

## 2014-12-01 ENCOUNTER — Ambulatory Visit
Admission: RE | Admit: 2014-12-01 | Discharge: 2014-12-01 | Disposition: A | Discharge status: Home / Self Care | Payer: Medicare Other | Source: Ambulatory Visit | Attending: Nephrology | Admitting: Nephrology

## 2014-12-01 DIAGNOSIS — R319 Hematuria, unspecified: Secondary | ICD-10-CM

## 2015-11-04 ENCOUNTER — Encounter (HOSPITAL_COMMUNITY): Payer: Self-pay | Admitting: *Deleted

## 2015-11-04 ENCOUNTER — Emergency Department (HOSPITAL_COMMUNITY)
Admission: EM | Admit: 2015-11-04 | Discharge: 2015-11-04 | Disposition: A | Discharge status: Home / Self Care | Payer: Medicare Other | Attending: Emergency Medicine | Admitting: Emergency Medicine

## 2015-11-04 ENCOUNTER — Emergency Department (HOSPITAL_COMMUNITY): Payer: Medicare Other

## 2015-11-04 DIAGNOSIS — K219 Gastro-esophageal reflux disease without esophagitis: Secondary | ICD-10-CM | POA: Insufficient documentation

## 2015-11-04 DIAGNOSIS — E039 Hypothyroidism, unspecified: Secondary | ICD-10-CM | POA: Diagnosis not present

## 2015-11-04 DIAGNOSIS — Z7982 Long term (current) use of aspirin: Secondary | ICD-10-CM | POA: Diagnosis not present

## 2015-11-04 DIAGNOSIS — K2970 Gastritis, unspecified, without bleeding: Secondary | ICD-10-CM | POA: Diagnosis not present

## 2015-11-04 DIAGNOSIS — E079 Disorder of thyroid, unspecified: Secondary | ICD-10-CM | POA: Insufficient documentation

## 2015-11-04 DIAGNOSIS — M546 Pain in thoracic spine: Secondary | ICD-10-CM | POA: Diagnosis not present

## 2015-11-04 DIAGNOSIS — R202 Paresthesia of skin: Secondary | ICD-10-CM | POA: Insufficient documentation

## 2015-11-04 DIAGNOSIS — R079 Chest pain, unspecified: Secondary | ICD-10-CM | POA: Diagnosis present

## 2015-11-04 DIAGNOSIS — M79609 Pain in unspecified limb: Secondary | ICD-10-CM

## 2015-11-04 DIAGNOSIS — I10 Essential (primary) hypertension: Secondary | ICD-10-CM | POA: Insufficient documentation

## 2015-11-04 DIAGNOSIS — Z79899 Other long term (current) drug therapy: Secondary | ICD-10-CM | POA: Diagnosis not present

## 2015-11-04 DIAGNOSIS — Z86018 Personal history of other benign neoplasm: Secondary | ICD-10-CM | POA: Diagnosis not present

## 2015-11-04 DIAGNOSIS — R0789 Other chest pain: Secondary | ICD-10-CM | POA: Diagnosis not present

## 2015-11-04 DIAGNOSIS — R110 Nausea: Secondary | ICD-10-CM

## 2015-11-04 DIAGNOSIS — E119 Type 2 diabetes mellitus without complications: Secondary | ICD-10-CM | POA: Insufficient documentation

## 2015-11-04 LAB — CBC
HCT: 38.3 % (ref 36.0–46.0)
Hemoglobin: 12.6 g/dL (ref 12.0–15.0)
MCH: 30.4 pg (ref 26.0–34.0)
MCHC: 32.9 g/dL (ref 30.0–36.0)
MCV: 92.3 fL (ref 78.0–100.0)
Platelets: 204 10*3/uL (ref 150–400)
RBC: 4.15 MIL/uL (ref 3.87–5.11)
RDW: 13.9 % (ref 11.5–15.5)
WBC: 6.1 10*3/uL (ref 4.0–10.5)

## 2015-11-04 LAB — BASIC METABOLIC PANEL
Anion gap: 8 (ref 5–15)
BUN: 17 mg/dL (ref 6–20)
CO2: 24 mmol/L (ref 22–32)
Calcium: 9.3 mg/dL (ref 8.9–10.3)
Chloride: 107 mmol/L (ref 101–111)
Creatinine, Ser: 0.89 mg/dL (ref 0.44–1.00)
GFR calc Af Amer: >60 mL/min (ref >60)
GFR calc non Af Amer: >60 mL/min (ref >60)
Glucose, Bld: 96 mg/dL (ref 65–99)
Potassium: 3.9 mmol/L (ref 3.5–5.1)
Sodium: 139 mmol/L (ref 135–145)

## 2015-11-04 LAB — I-STAT TROPONIN, ED
Troponin i, poc: 0 ng/mL (ref 0.00–0.08)
Troponin i, poc: 0 ng/mL (ref 0.00–0.08)

## 2015-11-04 MED ORDER — GI COCKTAIL ~~LOC~~
30.0000 mL | Freq: Once | ORAL | Status: AC
  Administered 2015-11-04: 30 mL via ORAL
  Filled 2015-11-04: qty 30

## 2015-11-04 MED ORDER — ONDANSETRON HCL 8 MG PO TABS: 8.0000 mg | ORAL_TABLET | Freq: Three times a day (TID) | ORAL | Status: DC | PRN

## 2015-11-04 MED ORDER — RANITIDINE HCL 150 MG PO TABS: 150.0000 mg | ORAL_TABLET | Freq: Every day | ORAL | Status: DC

## 2015-11-04 MED ORDER — ONDANSETRON HCL 4 MG/2ML IJ SOLN
4.0000 mg | Freq: Once | INTRAMUSCULAR | Status: AC
  Administered 2015-11-04: 4 mg via INTRAVENOUS
  Filled 2015-11-04: qty 2

## 2015-11-04 MED ORDER — MORPHINE SULFATE (PF) 4 MG/ML IV SOLN
4.0000 mg | Freq: Once | INTRAVENOUS | Status: AC
  Administered 2015-11-04: 4 mg via INTRAVENOUS
  Filled 2015-11-04: qty 1

## 2015-11-04 NOTE — ED Notes (Signed)
Pt stable, ambulatory, states understanding of discharge instructions 

## 2015-11-04 NOTE — ED Notes (Signed)
Patient C/O her heart fluttering last night and some burning in her left arm. States that the pain was about 8/10.  States that it felt like her heart was turning over in her chest.

## 2015-11-04 NOTE — Discharge Instructions (Signed)
Your pain is likely from gastritis or an ulcer. You will need to take zantac as directed, and avoid spicy/fatty/acidic foods. Avoid laying down flat within 30 minutes of eating. Avoid NSAIDs like ibuprofen on an empty stomach. Use zofran as needed for nausea. Use tylenol as needed for pain. Follow up with your regular doctor in one week and your cardiologist in 3-5 days for ongoing evaluation of your symptoms. Return to the ER for changes or worsening symptoms.  Abdominal (belly) pain can be caused by many things. Your caregiver performed an examination and possibly ordered blood/urine tests and imaging (CT scan, x-rays, ultrasound). Many cases can be observed and treated at home after initial evaluation in the emergency department. Even though you are being discharged home, abdominal pain can be unpredictable. Therefore, you need a repeated exam if your pain does not resolve, returns, or worsens. Most patients with abdominal pain don't have to be admitted to the hospital or have surgery, but serious problems like appendicitis and gallbladder attacks can start out as nonspecific pain. Many abdominal conditions cannot be diagnosed in one visit, so follow-up evaluations are very important. SEEK IMMEDIATE MEDICAL ATTENTION IF YOU DEVELOP ANY OF THE FOLLOWING SYMPTOMS:  The pain does not go away or becomes severe.   A temperature above 101 develops.   Repeated vomiting occurs (multiple episodes).   The pain becomes localized to portions of the abdomen. The right side could possibly be appendicitis. In an adult, the left lower portion of the abdomen could be colitis or diverticulitis.   Blood is being passed in stools or vomit (bright red or black tarry stools).   Return also if you develop chest pain, difficulty breathing, dizziness or fainting, or become confused, poorly responsive, or inconsolable (young children).  The constipation stays for more than 4 days.   There is belly (abdominal) or rectal  pain.   You do not seem to be getting better.      Nonspecific Chest Pain  Chest pain can be caused by many different conditions. There is always a chance that your pain could be related to something serious, such as a heart attack or a blood clot in your lungs. Chest pain can also be caused by conditions that are not life-threatening. If you have chest pain, it is very important to follow up with your health care provider. CAUSES  Chest pain can be caused by:  Heartburn.  Pneumonia or bronchitis.  Anxiety or stress.  Inflammation around your heart (pericarditis) or lung (pleuritis or pleurisy).  A blood clot in your lung.  A collapsed lung (pneumothorax). It can develop suddenly on its own (spontaneous pneumothorax) or from trauma to the chest.  Shingles infection (varicella-zoster virus).  Heart attack.  Damage to the bones, muscles, and cartilage that make up your chest wall. This can include:  Bruised bones due to injury.  Strained muscles or cartilage due to frequent or repeated coughing or overwork.  Fracture to one or more ribs.  Sore cartilage due to inflammation (costochondritis). RISK FACTORS  Risk factors for chest pain may include:  Activities that increase your risk for trauma or injury to your chest.  Respiratory infections or conditions that cause frequent coughing.  Medical conditions or overeating that can cause heartburn.  Heart disease or family history of heart disease.  Conditions or health behaviors that increase your risk of developing a blood clot.  Having had chicken pox (varicella zoster). SIGNS AND SYMPTOMS Chest pain can feel like:  Burning or  tingling on the surface of your chest or deep in your chest.  Crushing, pressure, aching, or squeezing pain.  Dull or sharp pain that is worse when you move, cough, or take a deep breath.  Pain that is also felt in your back, neck, shoulder, or arm, or pain that spreads to any of these  areas. Your chest pain may come and go, or it may stay constant. DIAGNOSIS Lab tests or other studies may be needed to find the cause of your pain. Your health care provider may have you take a test called an ambulatory ECG (electrocardiogram). An ECG records your heartbeat patterns at the time the test is performed. You may also have other tests, such as:  Transthoracic echocardiogram (TTE). During echocardiography, sound waves are used to create a picture of all of the heart structures and to look at how blood flows through your heart.  Transesophageal echocardiogram (TEE).This is a more advanced imaging test that obtains images from inside your body. It allows your health care provider to see your heart in finer detail.  Cardiac monitoring. This allows your health care provider to monitor your heart rate and rhythm in real time.  Holter monitor. This is a portable device that records your heartbeat and can help to diagnose abnormal heartbeats. It allows your health care provider to track your heart activity for several days, if needed.  Stress tests. These can be done through exercise or by taking medicine that makes your heart beat more quickly.  Blood tests.  Imaging tests. TREATMENT  Your treatment depends on what is causing your chest pain. Treatment may include:  Medicines. These may include:  Acid blockers for heartburn.  Anti-inflammatory medicine.  Pain medicine for inflammatory conditions.  Antibiotic medicine, if an infection is present.  Medicines to dissolve blood clots.  Medicines to treat coronary artery disease.  Supportive care for conditions that do not require medicines. This may include:  Resting.  Applying heat or cold packs to injured areas.  Limiting activities until pain decreases. HOME CARE INSTRUCTIONS  If you were prescribed an antibiotic medicine, finish it all even if you start to feel better.  Avoid any activities that bring on chest  pain.  Do not use any tobacco products, including cigarettes, chewing tobacco, or electronic cigarettes. If you need help quitting, ask your health care provider.  Do not drink alcohol.  Take medicines only as directed by your health care provider.  Keep all follow-up visits as directed by your health care provider. This is important. This includes any further testing if your chest pain does not go away.  If heartburn is the cause for your chest pain, you may be told to keep your head raised (elevated) while sleeping. This reduces the chance that acid will go from your stomach into your esophagus.  Make lifestyle changes as directed by your health care provider. These may include:  Getting regular exercise. Ask your health care provider to suggest some activities that are safe for you.  Eating a heart-healthy diet. A registered dietitian can help you to learn healthy eating options.  Maintaining a healthy weight.  Managing diabetes, if necessary.  Reducing stress. SEEK MEDICAL CARE IF:  Your chest pain does not go away after treatment.  You have a rash with blisters on your chest.  You have a fever. SEEK IMMEDIATE MEDICAL CARE IF:   Your chest pain is worse.  You have an increasing cough, or you cough up blood.  You have severe  abdominal pain.  You have severe weakness.  You faint.  You have chills.  You have sudden, unexplained chest discomfort.  You have sudden, unexplained discomfort in your arms, back, neck, or jaw.  You have shortness of breath at any time.  You suddenly start to sweat, or your skin gets clammy.  You feel nauseous or you vomit.  You suddenly feel light-headed or dizzy.  Your heart begins to beat quickly, or it feels like it is skipping beats. These symptoms may represent a serious problem that is an emergency. Do not wait to see if the symptoms will go away. Get medical help right away. Call your local emergency services (911 in the  [place] Do not drive yourself to the hospital.   This information is not intended to replace advice given to you by your health care provider. Make sure you discuss any questions you have with your health care provider.   Document Released: 09/26/2005 Document Revised: 01/07/2015 Document Reviewed: 07/23/2014 Elsevier Interactive Patient Education 2016 [employer]  Nausea, Adult Nausea is the feeling that you have an upset stomach or have to vomit. Nausea by itself is not likely a serious concern, but it may be an early sign of more serious medical problems. As nausea gets worse, it can lead to vomiting. If vomiting develops, there is the risk of dehydration.  CAUSES   Viral infections.  Food poisoning.  Medicines.  Pregnancy.  Motion sickness.  Migraine headaches.  Emotional distress.  Severe pain from any source.  Alcohol intoxication. HOME CARE INSTRUCTIONS  Get plenty of rest.  Ask your caregiver about specific rehydration instructions.  Eat small amounts of food and sip liquids more often.  Take all medicines as told by your caregiver. SEEK MEDICAL CARE IF:  You have not improved after 2 days, or you get worse.  You have a headache. SEEK IMMEDIATE MEDICAL CARE IF:   You have a fever.  You faint.  You keep vomiting or have blood in your vomit.  You are extremely weak or dehydrated.  You have dark or bloody stools.  You have severe chest or abdominal pain. MAKE SURE YOU:  Understand these instructions.  Will watch your condition.  Will get help right away if you are not doing well or get worse.   This information is not intended to replace advice given to you by your health care provider. Make sure you discuss any questions you have with your health care provider.   Document Released: 01/24/2005 Document Revised: 01/07/2015 Document Reviewed: 08/29/2011 Elsevier Interactive Patient Education 2016 [place] is a  feeling of pain, discomfort, burning, or fullness in the upper part of your belly (abdomen). It can come and go. It may occur often or rarely. Indigestion tends to happen while you are eating or right after you have finished eating. It may be worse at night and while bending over or lying down. HOME CARE Take these actions to lessen your pain or discomfort and to help avoid problems. Diet  Follow a diet as told by your doctor. You may need to avoid foods and drinks such as:  Coffee and tea (with or without caffeine).  Drinks that contain alcohol.  Energy drinks and sports drinks.  Carbonated drinks or sodas.  Chocolate and cocoa.  Peppermint and mint flavorings.  Garlic and onions.  Horseradish.  Spicy and acidic foods, such as peppers, chili powder, curry powder, vinegar, hot sauces, and BBQ sauce.  Citrus fruit juices and  citrus fruits, such as oranges, lemons, and limes.  Tomato-based foods, such as red sauce, chili, salsa, and pizza with red sauce.  Fried and fatty foods, such as donuts, french fries, potato chips, and high-fat dressings.  High-fat meats, such as hot dogs, rib eye steak, sausage, ham, and bacon.  High-fat dairy items, such as whole milk, butter, and cream cheese.  Eat small meals often. Avoid eating large meals.  Avoid drinking large amounts of liquid with your meals.  Avoid eating meals during the 2-3 hours before bedtime.  Avoid lying down right after you eat.  Do not exercise right after you eat. General Instructions  Pay attention to any changes in your symptoms.  Take over-the-counter and prescription medicines only as told by your doctor. Do not take aspirin, ibuprofen, or other NSAIDs unless your doctor says it is okay.  Do not use any tobacco products, including cigarettes, chewing tobacco, and e-cigarettes. If you need help quitting, ask your doctor.  Wear loose clothes. Do not wear anything tight around your waist.  Raise (elevate)  the head of your bed about 6 inches (15 cm).  Try to lower your stress. If you need help doing this, ask your doctor.  If you are overweight, lose an amount of weight that is healthy for you. Ask your doctor about a safe weight loss goal.  Keep all follow-up visits as told by your doctor. This is important. GET HELP IF:  You have new symptoms.  You lose weight and you do not know why it is happening.  You have trouble swallowing, or it hurts to swallow.  Your symptoms do not get better with treatment.  Your symptoms last for more than two days.  You have a fever.  You throw up (vomit). GET HELP RIGHT AWAY IF:  You have pain in your arms, neck, jaw, teeth, or back.  You feel sweaty, dizzy, or light-headed.  You pass out (faint).  You have chest pain or shortness of breath.  You cannot stop throwing up, or you throw up blood.  Your poop (stool) is bloody or black.  You have very bad pain in your belly.   This information is not intended to replace advice given to you by your health care provider. Make sure you discuss any questions you have with your health care provider.   Document Released: 01/19/2011 Document Revised: 09/07/2015 Document Reviewed: 04/13/2015 Elsevier Interactive Patient Education 2016 [place].  Gastroesophageal Reflux Disease, Adult Normally, food travels down the esophagus and stays in the stomach to be digested. If a person has gastroesophageal reflux disease (GERD), food and stomach acid move back up into the esophagus. When this happens, the esophagus becomes sore and swollen (inflamed). Over time, GERD can make small holes (ulcers) in the lining of the esophagus. HOME CARE Diet  Follow a diet as told by your doctor. You may need to avoid foods and drinks such as:  Coffee and tea (with or without caffeine).  Drinks that contain alcohol.  Energy drinks and sports drinks.  Carbonated drinks or sodas.  Chocolate and  cocoa.  Peppermint and mint flavorings.  Garlic and onions.  Horseradish.  Spicy and acidic foods, such as peppers, chili powder, curry powder, vinegar, hot sauces, and BBQ sauce.  Citrus fruit juices and citrus fruits, such as oranges, lemons, and limes.  Tomato-based foods, such as red sauce, chili, salsa, and pizza with red sauce.  Fried and fatty foods, such as donuts, french fries, potato chips, and  high-fat dressings.  High-fat meats, such as hot dogs, rib eye steak, sausage, ham, and bacon.  High-fat dairy items, such as whole milk, butter, and cream cheese.  Eat small meals often. Avoid eating large meals.  Avoid drinking large amounts of liquid with your meals.  Avoid eating meals during the 2-3 hours before bedtime.  Avoid lying down right after you eat.  Do not exercise right after you eat. General Instructions  Pay attention to any changes in your symptoms.  Take over-the-counter and prescription medicines only as told by your doctor. Do not take aspirin, ibuprofen, or other NSAIDs unless your doctor says it is okay.  Do not use any tobacco products, including cigarettes, chewing tobacco, and e-cigarettes. If you need help quitting, ask your doctor.  Wear loose clothes. Do not wear anything tight around your waist.  Raise (elevate) the head of your bed about 6 inches (15 cm).  Try to lower your stress. If you need help doing this, ask your doctor.  If you are overweight, lose an amount of weight that is healthy for you. Ask your doctor about a safe weight loss goal.  Keep all follow-up visits as told by your doctor. This is important. GET HELP IF:  You have new symptoms.  You lose weight and you do not know why it is happening.  You have trouble swallowing, or it hurts to swallow.  You have wheezing or a cough that keeps happening.  Your symptoms do not get better with treatment.  You have a hoarse voice. GET HELP RIGHT AWAY IF:  You have  pain in your arms, neck, jaw, teeth, or back.  You feel sweaty, dizzy, or light-headed.  You have chest pain or shortness of breath.  You throw up (vomit) and your throw up looks like blood or coffee grounds.  You pass out (faint).  Your poop (stool) is bloody or black.  You cannot swallow, drink, or eat.   This information is not intended to replace advice given to you by your health care provider. Make sure you discuss any questions you have with your health care provider.   Document Released: 06/04/2008 Document Revised: 09/07/2015 Document Reviewed: 04/13/2015 Elsevier Interactive Patient Education 2016 [place] for Gastroesophageal Reflux Disease, Adult When you have gastroesophageal reflux disease (GERD), the foods you eat and your eating habits are very important. Choosing the right foods can help ease your discomfort.  WHAT GUIDELINES DO I NEED TO FOLLOW?   Choose fruits, vegetables, whole grains, and low-fat dairy products.   Choose low-fat meat, fish, and poultry.  Limit fats such as oils, salad dressings, butter, nuts, and avocado.   Keep a food diary. This helps you identify foods that cause symptoms.   Avoid foods that cause symptoms. These may be different for everyone.   Eat small meals often instead of 3 large meals a day.   Eat your meals slowly, in a place where you are relaxed.   Limit fried foods.   Cook foods using methods other than frying.   Avoid drinking alcohol.   Avoid drinking large amounts of liquids with your meals.   Avoid bending over or lying down until 2-3 hours after eating.  WHAT FOODS ARE NOT RECOMMENDED?  These are some foods and drinks that may make your symptoms worse: Vegetables Tomatoes. Tomato juice. Tomato and spaghetti sauce. Chili peppers. Onion and garlic. Horseradish. Fruits Oranges, grapefruit, and lemon (fruit and juice). Meats High-fat meats, fish, and poultry.  This includes hot  dogs, ribs, ham, sausage, salami, and bacon. Dairy Whole milk and chocolate milk. Sour cream. Cream. Butter. Ice cream. Cream cheese.  Drinks Coffee and tea. Bubbly (carbonated) drinks or energy drinks. Condiments Hot sauce. Barbecue sauce.  Sweets/Desserts Chocolate and cocoa. Donuts. Peppermint and spearmint. Fats and Oils High-fat foods. This includes [place] fries and potato chips. Other Vinegar. Strong spices. This includes black pepper, white pepper, red pepper, cayenne, curry powder, cloves, ginger, and chili powder. The items listed above may not be a complete list of foods and drinks to avoid. Contact your dietitian for more information.   This information is not intended to replace advice given to you by your health care provider. Make sure you discuss any questions you have with your health care provider.   Document Released: 06/17/2012 Document Revised: 01/07/2015 Document Reviewed: 10/21/2013 Elsevier Interactive Patient Education 2016 Elsevier Inc.  Gastritis, Adult Gastritis is soreness and swelling (inflammation) of the lining of the stomach. Gastritis can develop as a sudden onset (acute) or long-term (chronic) condition. If gastritis is not treated, it can lead to stomach bleeding and ulcers. CAUSES  Gastritis occurs when the stomach lining is weak or damaged. Digestive juices from the stomach then inflame the weakened stomach lining. The stomach lining may be weak or damaged due to viral or bacterial infections. One common bacterial infection is the Helicobacter pylori infection. Gastritis can also result from excessive alcohol consumption, taking certain medicines, or having too much acid in the stomach.  SYMPTOMS  In some cases, there are no symptoms. When symptoms are present, they may include:  Pain or a burning sensation in the upper abdomen.  Nausea.  Vomiting.  An uncomfortable feeling of fullness after eating. DIAGNOSIS  Your caregiver may suspect you  have gastritis based on your symptoms and a physical exam. To determine the cause of your gastritis, your caregiver may perform the following:  Blood or stool tests to check for the H pylori bacterium.  Gastroscopy. A thin, flexible tube (endoscope) is passed down the esophagus and into the stomach. The endoscope has a light and camera on the end. Your caregiver uses the endoscope to view the inside of the stomach.  Taking a tissue sample (biopsy) from the stomach to examine under a microscope. TREATMENT  Depending on the cause of your gastritis, medicines may be prescribed. If you have a bacterial infection, such as an H pylori infection, antibiotics may be given. If your gastritis is caused by too much acid in the stomach, H2 blockers or antacids may be given. Your caregiver may recommend that you stop taking aspirin, ibuprofen, or other nonsteroidal anti-inflammatory drugs (NSAIDs). HOME CARE INSTRUCTIONS  Only take over-the-counter or prescription medicines as directed by your caregiver.  If you were given antibiotic medicines, take them as directed. Finish them even if you start to feel better.  Drink enough fluids to keep your urine clear or pale yellow.  Avoid foods and drinks that make your symptoms worse, such as:  Caffeine or alcoholic drinks.  Chocolate.  Peppermint or mint flavorings.  Garlic and onions.  Spicy foods.  Citrus fruits, such as oranges, lemons, or limes.  Tomato-based foods such as sauce, chili, salsa, and pizza.  Fried and fatty foods.  Eat small, frequent meals instead of large meals. SEEK IMMEDIATE MEDICAL CARE IF:   You have black or dark red stools.  You vomit blood or material that looks like coffee grounds.  You are unable to keep fluids down.  Your abdominal pain gets worse.  You have a fever.  You do not feel better after 1 week.  You have any other questions or concerns. MAKE SURE YOU:  Understand these instructions.  Will  watch your condition.  Will get help right away if you are not doing well or get worse.   This information is not intended to replace advice given to you by your health care provider. Make sure you discuss any questions you have with your health care provider.   Document Released: 12/11/2001 Document Revised: 06/17/2012 Document Reviewed: 01/30/2012 Elsevier Interactive Patient Education 2016 Elsevier Inc.  Paresthesia Paresthesia is an abnormal burning or prickling sensation. This sensation is generally felt in the hands, arms, legs, or feet. However, it may occur in any part of the body. Usually, it is not painful. The feeling may be described as:  Tingling or numbness.  Pins and needles.  Skin crawling.  Buzzing.  Limbs falling asleep.  Itching. Most people experience temporary (transient) paresthesia at some time in their lives. Paresthesia may occur when you breathe too quickly (hyperventilation). It can also occur without any apparent cause. Commonly, paresthesia occurs when pressure is placed on a nerve. The sensation quickly goes away after the pressure is removed. For some people, however, paresthesia is a long-lasting (chronic) condition that is caused by an underlying disorder. If you continue to have paresthesia, you may need further medical evaluation. HOME CARE INSTRUCTIONS Watch your condition for any changes. Taking the following actions may help to lessen any discomfort that you are feeling:  Avoid drinking alcohol.  Try acupuncture or massage to help relieve your symptoms.  Keep all follow-up visits as directed by your health care provider. This is important. SEEK MEDICAL CARE IF:  You continue to have episodes of paresthesia.  Your burning or prickling feeling gets worse when you walk.  You have pain, cramps, or dizziness.  You develop a rash. SEEK IMMEDIATE MEDICAL CARE IF:  You feel weak.  You have trouble walking or moving.  You have problems with  speech, understanding, or vision.  You feel confused.  You cannot control your bladder or bowel movements.  You have numbness after an injury.  You faint.   This information is not intended to replace advice given to you by your health care provider. Make sure you discuss any questions you have with your health care provider.   Document Released: 12/07/2002 Document Revised: 05/03/2015 Document Reviewed: 12/13/2014 Elsevier Interactive Patient Education [employer].

## 2015-11-04 NOTE — ED Notes (Signed)
Pt reports pain that started last night, mainly in her back and into her neck and down left arm. Having mild nausea, denies sob.

## 2015-11-04 NOTE — ED Provider Notes (Signed)
CSN: [id number]     Arrival date & time 11/04/15  1436 History   First MD Initiated Contact with Patient 11/04/15 1643     Chief Complaint  Patient presents with  . Back Pain  . Chest Pain     (Consider location/radiation/quality/duration/timing/severity/associated sxs/prior Treatment) HPI Comments: Kristy Fuller is a 69 y.o. female with a PMHx of DM2, hypothyroidism, HTN, dysphagia, sciatica, remote pericardial effusion, and cholelithiasis s/p cholecystectomy, who presents to the ED with complaints of sudden onset chest pain and palpitations that began last night around 1 AM while she was sitting in bed. She describes the pain as 8/10 left lateral chest pain radiating to the left jaw neck arm and left mid back, sharp and burning in nature, constant, with no known aggravating factors, unchanged with inspiration or movement/exertion, and unrelieved with 2 baby aspirin. She reports the pain has subsided and is currently a 1/10. Associated symptoms include nausea and left arm tingling.  Denies any diaphoresis, lightheadedness, fevers, chills, shortness of breath, cough, leg swelling, recent travel/surgery/immobilization, OCP/oral estrogen use, history of DVT/PE, claudication, orthopnea, abdominal pain, vomiting, diarrhea, constipation, dysuria, hematuria, numbness, or weakness. She is a nonsmoker with no family cardiac history.  She follows up with Dr. Kizzie Ide at [facility] for cardiology. She states she recently saw him. Last Echo was done in 2015 and showed a stable pericardial effusion.  Patient is a 69 y.o. female presenting with back pain and chest pain. The history is provided by the patient. No language interpreter was used.  Back Pain Associated symptoms: chest pain   Associated symptoms: no abdominal pain, no dysuria, no fever, no numbness and no weakness   Chest Pain Pain location:  L lateral chest Pain quality: burning and sharp   Pain radiates to:  Mid back Pain radiates  to the back: yes   Pain severity:  Mild Onset quality:  Sudden Duration:  15 hours Timing:  Constant Progression:  Partially resolved Chronicity:  New Context: at rest   Relieved by:  Nothing Worsened by:  Nothing tried Ineffective treatments:  Aspirin Associated symptoms: back pain, nausea and palpitations   Associated symptoms: no abdominal pain, no claudication, no cough, no diaphoresis, no fever, no lower extremity edema, no numbness, no orthopnea, no shortness of breath, not vomiting and no weakness   Risk factors: diabetes mellitus and hypertension   Risk factors: no birth control, no coronary artery disease, no immobilization, no prior DVT/PE, no smoking and no surgery     Past Medical History  Diagnosis Date  . Diabetes mellitus   . Thyroid disease   . Hypertension   . Benign neoplasm of colon   . Insomnia, unspecified   . Pain in limb   . Dysphagia, unspecified(787.20)   . Sciatica   . Family history of bleeding disorder 03/17/2012  . Gallstones   . Hypothyroidism    History reviewed. No pertinent past surgical history. History reviewed. No pertinent family history. Social History  Substance Use Topics  . Smoking status: Never Smoker   . Smokeless tobacco: None  . Alcohol Use: No   OB History    No data available     Review of Systems  Constitutional: Negative for fever, chills and diaphoresis.  Respiratory: Negative for cough and shortness of breath.   Cardiovascular: Positive for chest pain and palpitations. Negative for orthopnea, claudication and leg swelling.  Gastrointestinal: Positive for nausea. Negative for vomiting, abdominal pain, diarrhea and constipation.  Genitourinary: Negative for dysuria and  hematuria.  Musculoskeletal: Positive for back pain. Negative for myalgias and arthralgias.  Skin: Negative for color change.  Allergic/Immunologic: Positive for immunocompromised state (diabetic).  Neurological: Negative for weakness, light-headedness  and numbness.       +tingling L arm  Psychiatric/Behavioral: Negative for confusion.   10 Systems reviewed and are negative for acute change except as noted in the HPI.    Allergies  Review of patient's allergies indicates no known allergies.  Home Medications   Prior to Admission medications   Medication Sig Start Date End Date Taking? Authorizing Provider  aspirin 81 MG tablet Take 81 mg by mouth daily.    Historical Provider, MD  Biotin 5000 MCG CAPS Take 5,000 mcg by mouth.    Historical Provider, MD  Cholecalciferol (D-3-5) 5000 UNITS capsule Take 10,000 Units by mouth daily.    Historical Provider, MD  co-enzyme Q-10 30 MG capsule Take 200 mg by mouth daily.    Historical Provider, MD  fish oil-omega-3 fatty acids 1000 MG capsule Take 2 g by mouth daily.    Historical Provider, MD  glucosamine-chondroitin 500-400 MG tablet Take 1 tablet by mouth 3 (three) times daily.    Historical Provider, MD  Javier Docker Oil 500 MG CAPS Take 500 mg by mouth daily.    Historical Provider, MD  levothyroxine (SYNTHROID, LEVOTHROID) 100 MCG tablet Take by mouth daily.    Historical Provider, MD  losartan (COZAAR) 50 MG tablet Take 50 mg by mouth daily.    Historical Provider, MD  metFORMIN (GLUCOPHAGE-XR) 750 MG 24 hr tablet Take 750 mg by mouth daily with breakfast.    Historical Provider, MD  NON FORMULARY 400 mg daily. Tumeric    Historical Provider, MD  vitamin B-12 (CYANOCOBALAMIN) 1000 MCG tablet Take 1,000 mcg by mouth daily.    Historical Provider, MD   BP 122/78 mmHg  Pulse 67  Temp(Src) 97.9 F (36.6 C) (Oral)  Resp 18  Ht 5\' 5"  (1.651 m)  Wt 195 lb (88.451 kg)  BMI 32.45 kg/m2  SpO2 98% Physical Exam  Constitutional: She is oriented to person, place, and time. Vital signs are normal. She appears well-developed and well-nourished.  Non-toxic appearance. No distress.  Afebrile, nontoxic, NAD  HENT:  Head: Normocephalic and atraumatic.  Mouth/Throat: Oropharynx is clear and moist and  mucous membranes are normal.  Eyes: Conjunctivae and EOM are normal. Right eye exhibits no discharge. Left eye exhibits no discharge.  Neck: Normal range of motion. Neck supple.  Cardiovascular: Normal rate, regular rhythm, normal heart sounds and intact distal pulses.  Exam reveals no gallop and no friction rub.   No murmur heard. RRR, nl s1/s2, no m/r/g, distal pulses intact, no pedal edema   Pulmonary/Chest: Effort normal and breath sounds normal. No respiratory distress. She has no decreased breath sounds. She has no wheezes. She has no rhonchi. She has no rales. She exhibits tenderness. She exhibits no crepitus, no deformity and no retraction.    CTAB in all lung fields, no w/r/r, no hypoxia or increased WOB, speaking in full sentences, SpO2 97% on RA  Chest wall with mild L sided TTP, no crepitus or deformity, no retractions  Abdominal: Soft. Normal appearance and bowel sounds are normal. She exhibits no distension. There is no tenderness. There is no rigidity, no rebound, no guarding, no CVA tenderness, no tenderness at McBurney's point and negative Murphy's sign.  Soft, NTND, +BS throughout, no r/g/r, neg murphy's, neg mcburney's, no CVA TTP   Musculoskeletal: Normal range  of motion.  MAE x4 Strength and sensation grossly intact Distal pulses intact No pedal edema, neg homan's bilaterally   Neurological: She is alert and oriented to person, place, and time. She has normal strength. No sensory deficit.  Skin: Skin is warm, dry and intact. No rash noted.  Psychiatric: She has a normal mood and affect.  Nursing note and vitals reviewed.   ED Course  Procedures (including critical care time) Labs Review Labs Reviewed  BASIC METABOLIC PANEL  CBC  I-STAT [place], ED  [facility], ED    Imaging Review Dg Chest 2 View  11/04/2015  CLINICAL DATA:  Acute left lower chest and back pain for 3 days. EXAM: CHEST  2 VIEW COMPARISON:  None available FINDINGS: Normal heart size and  vascularity. Mild hyperinflation and slight apical pleural parenchymal scarring. No focal pneumonia, collapse or consolidation. Negative for edema, effusion, or pneumothorax. Trachea midline. Prior cholecystectomy evident. No acute osseous finding. IMPRESSION: Mild hyperinflation.  No acute chest process. Electronically Signed   By: Jerilynn Mages.  Shick M.D.   On: 11/04/2015 15:36   ECHO 2D W OR WO M-MODE LIMITED1/29/2015  [facility]  Component Name Value Range  ECHO LVEF 55 to 60%    Result Narrative   [facility]  TRANSTHORACIC ECHOCARDIOGRAM REPORT   --------------------------------------------------------------------------------   Patient Name:   Kristy Fuller Date of Exam:   01/28/2014 MPI:            [id number] MRN:            [id number] Date of Birth:  07/28/1946 Gender:         F Height:         65 in Weight:         90.7 kg BSA:            2.0 m Blood Pressure: 113/77 mmHg  Facility:           [facility] Procedure:          Limited Echo Indication:         Pericardial Effusion Sonographer:        Carlisle Cater Referring Provider: Lenna Sciara. Slowikowski, MD   Summary  1. Overall left ventricular ejection fraction is estimated at 55 to 60%.  2. Normal global left ventricular systolic function.  3. Mild concentric left ventricular hypertrophy.  4. Small pericardial effusion.  5. No intracardiac thrombi, mass or vegetations.  Left Ventricle: Overall left ventricular ejection fraction is estimated at 55 to 60%. The left ventricular internal cavity size was normal. LV septal wall thickness was mildly increased. LV posterior wall thickness is mildly increased. Mild concentric left ventricular hypertrophy. Global LV systolic function was normal.  Right Ventricle: Normal right ventricular size, wall thickness, and systolic function.  Left Atrium: The left atrium is normal in size.  Right Atrium: The right atrium is  normal in size.  Pericardium: A small pericardial effusion is present. The pericardial effusion is circumferential. There is no evidence of cardiac tamponade.  Mitral Valve: Structurally normal mitral valve, with normal leaflet excursion; without any evidence of mitral stenosis. Trace mitral valve regurgitation is seen.  Tricuspid Valve: The tricuspid valve is normal in structure. No tricuspid regurgitation is visualized.  Aortic Valve: The aortic valve is trileaflet and structurally normal, with normal leaflet excursion; without any evidence of aortic stenosis.  The peak pressure gradient across the aortic valve is 4.8 mmHg, and the mean pressure gradient is 2.8 mmHg. No evidence of aortic valve regurgitation is seen.  Pulmonic Valve: The pulmonic valve is normal. No indication of pulmonic valve regurgitation.  Aorta: The aorta was not well visualized.  Venous: The inferior vena cava is not well seen.  Shunts: No evidence of intracardiac shunt.  Additional Comments: No intracardiac thrombi, mass or vegetations are visualized at this level of resolution.  2D AND M-MODE MEASUREMENTS (normal ranges within parentheses) Left Ventricle: IVSd-2D (0.7-1.1cm):  1.10 cm LVPWd-2D (0.7-1.1cm): 1.14 cm LVIDd-2D (3.4-5.7cm): 5.04 cm LVIDs-2D (1.9-3.7cm): 3.39 cm LV FS-2D (>25%):      32.7 %  Aorta/Left Atrium: AoV Cusp Separation (1.5-2.6cm): 2.0 cm Left Atrium-Mmode (1.9-4.0cm):   3.4 cm   LV DIASTOLIC FUNCTION: MV Peak E: 0.43 m/s MV Peak A: 0.67 m/s E/A Ratio: 0.64  Mitral Valve: MV P1/2 Time: 98.29 msec MV Area, PHT: 2.24 cm  Aortic Valve: AoV Max Vel: 1.10 m/s AoV Peak PG: 4.8 mmHg AoV Mean PG: 2.8 mmHg     Electronically signed by Julian Reil, MD Signature Date/Time: 01/29/2014/7:56:54 AM     I have personally reviewed and evaluated these images and lab results as part of my medical decision-making.   EKG Interpretation   Date/Time:  Friday November 04 2015 14:46:30 EDT Ventricular Rate:  62 PR Interval:  128 QRS Duration: 70 QT Interval:  418 QTC Calculation: 424 R Axis:   18 Text Interpretation:  Normal sinus rhythm with sinus arrhythmia Low  voltage QRS Cannot rule out Anterior infarct , age undetermined Abnormal  ECG Nonspecific T wave abnormality No old tracing to compare Confirmed by  GOLDSTON  MD, SCOTT ([id number]) on 11/04/2015 4:39:52 PM      MDM   Final diagnoses:  Atypical chest pain  Paresthesia and pain of left extremity  Nausea  Gastritis  Gastroesophageal reflux disease, esophagitis presence not specified  Left-sided thoracic back pain    69 y.o. female here with L lateral chest pain radiating to L back/L arm/L neck with some associated tingling in L arm and nausea, palpitations last night which resolved. Overall symptoms have improved. On exam, some tenderness to L chest wall, all extremities NVI with soft compartments, no LE swelling, no tachycardia or hypoxia, doubt PE/DVT/dissection. Labs unremarkable with trop neg at 14hr mark which is reassuring. CXR clear. EKG with low voltage QRS but no EKG to compare to. Will give zofran, GI cocktail, and morphine and then reassess. Will also get repeat trop at 3hr mark from initial troponin. Likely either musculoskeletal or GERD related. Will reassess shortly.   7:06 PM Pain improved after GI cocktail/morphine, nausea resolved, tolerating PO well here. Second trop neg. Discussed case with Dr. Regenia Skeeter who agrees with plan to d/c home with zantac and zofran, discussed GERD diet modifications, and f/up closely with PCP and cardiologist. Doubt need for admission given two neg troponins and atypical story, and resolution of symptoms with GI cocktail/morphine. Doubt ACS/dissection/PE/etc. I explained the diagnosis and have given explicit precautions to return to the ER including for any other new or worsening symptoms. The patient understands and accepts the medical plan as it's been  dictated and I have answered their questions. Discharge instructions concerning home care and prescriptions have been given. The patient is STABLE and is discharged to home in good condition.  BP 125/65 mmHg  Pulse 53  Temp(Src) 98.1 F (36.7 C) (Oral)  Resp 15  Ht 5\' 5"  (1.651 m)  Wt 195 lb (88.451 kg)  BMI 32.45 kg/m2  SpO2 95%  Meds ordered this encounter  Medications  . gi cocktail (Maalox,Lidocaine,Donnatal)    Sig:   . morphine 4 MG/ML injection 4 mg    Sig:   . ondansetron (ZOFRAN) injection 4 mg    Sig:   . ranitidine (ZANTAC) 150 MG tablet    Sig: Take 1 tablet (150 mg total) by mouth at bedtime.    Dispense:  30 tablet    Refill:  0    Order Specific Question:  Supervising Provider    Answer:  MILLER, BRIAN [[id number]]  . ondansetron (ZOFRAN) 8 MG tablet    Sig: Take 1 tablet (8 mg total) by mouth every 8 (eight) hours as needed for nausea or vomiting.    Dispense:  10 tablet    Refill:  0    Order Specific Question:  Supervising Provider    Answer:  Noemi Chapel [[id number]]     Mercedes Camprubi-Soms, PA-C 11/04/15 [place], MD 11/07/15 1524

## 2015-11-04 NOTE — ED Notes (Signed)
Pt remains monitored by blood pressure, pulse ox, and 5 lead. pts family remains at bedside.  

## 2017-04-01 DIAGNOSIS — I10 Essential (primary) hypertension: Secondary | ICD-10-CM | POA: Insufficient documentation

## 2017-06-14 IMAGING — DX DG CHEST 2V
2 series · 2 of 2 positions shown · non-contrast
Comparison: None available

CLINICAL DATA: Acute left lower chest and back pain for 3 days.

EXAM:
CHEST  2 VIEW

[chest pa]
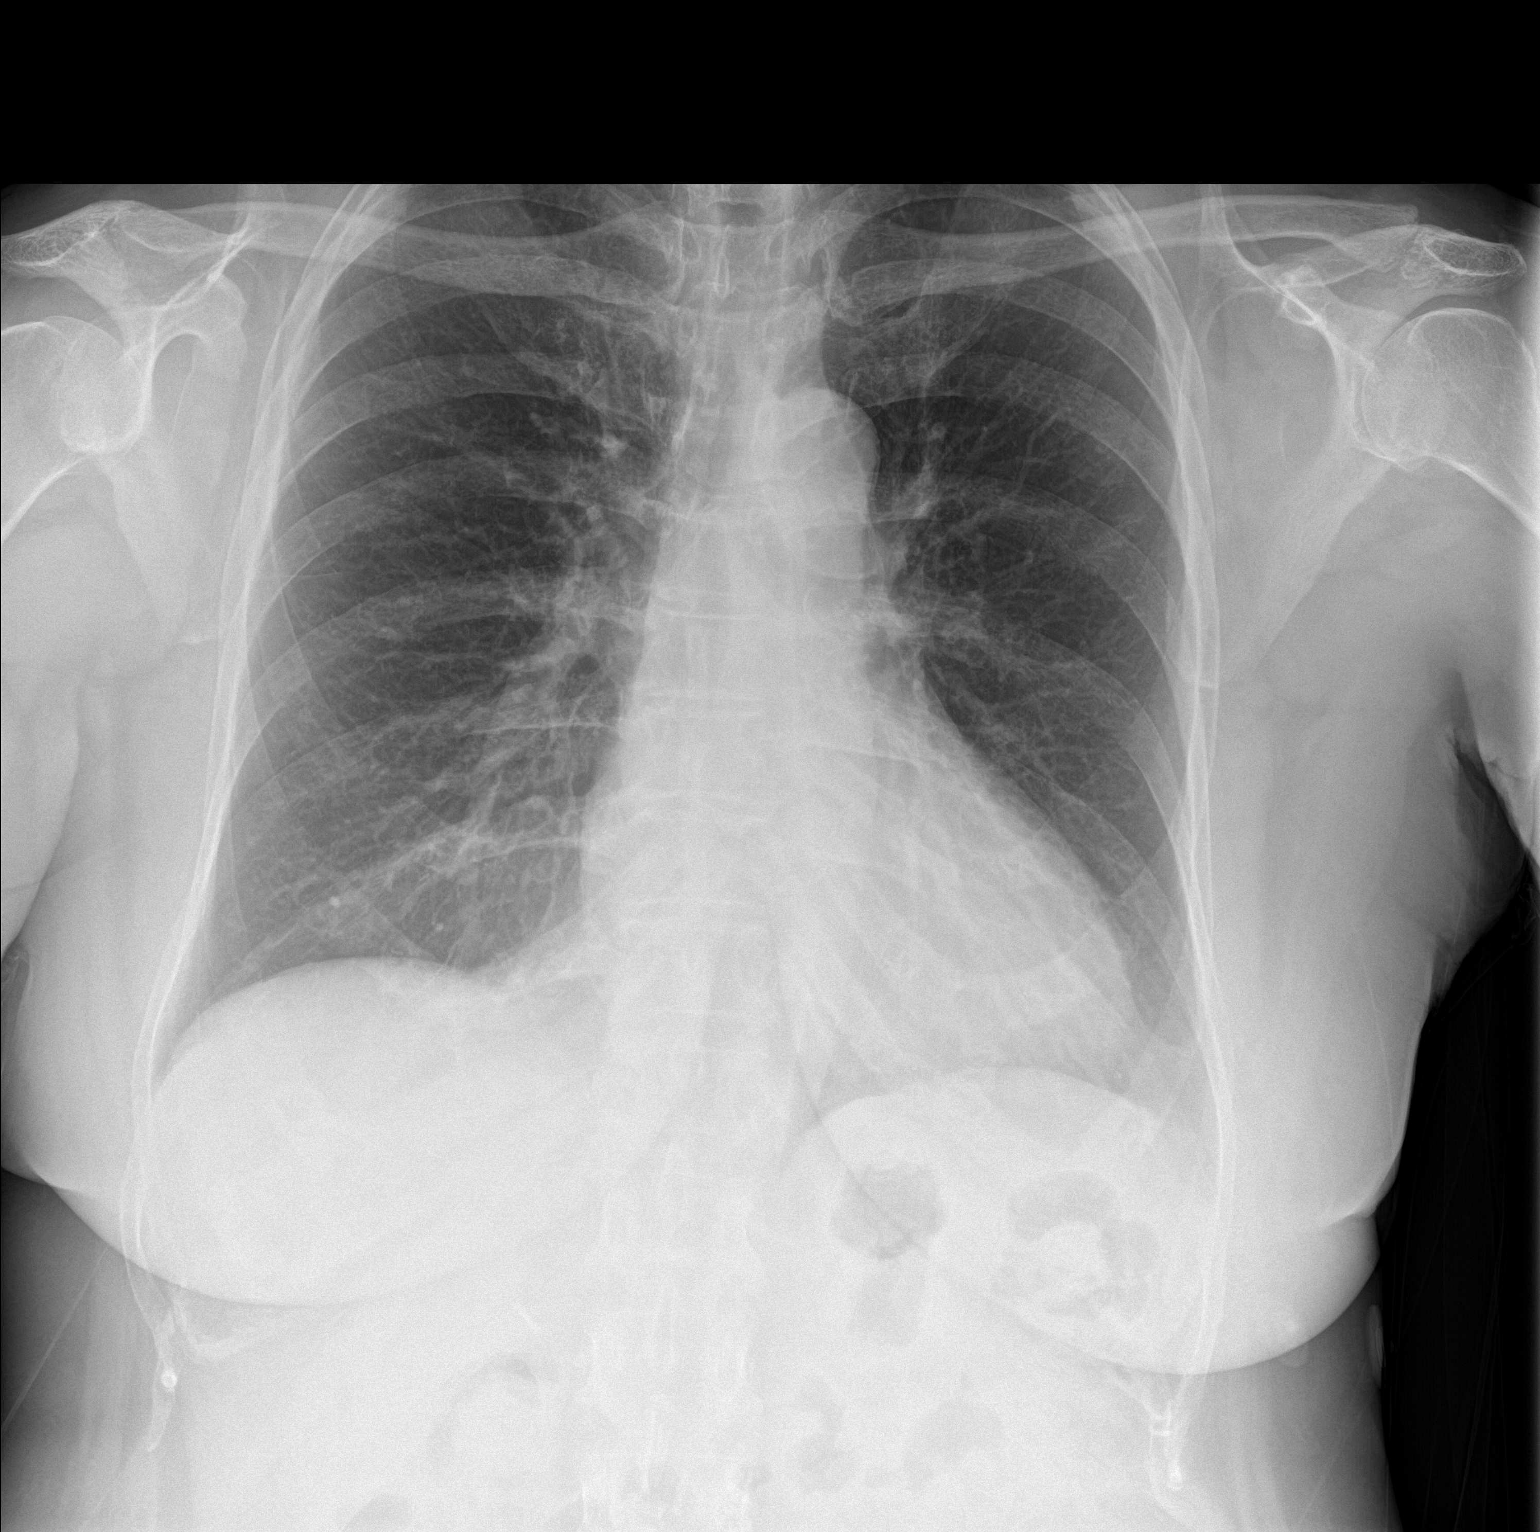

[chest lat]
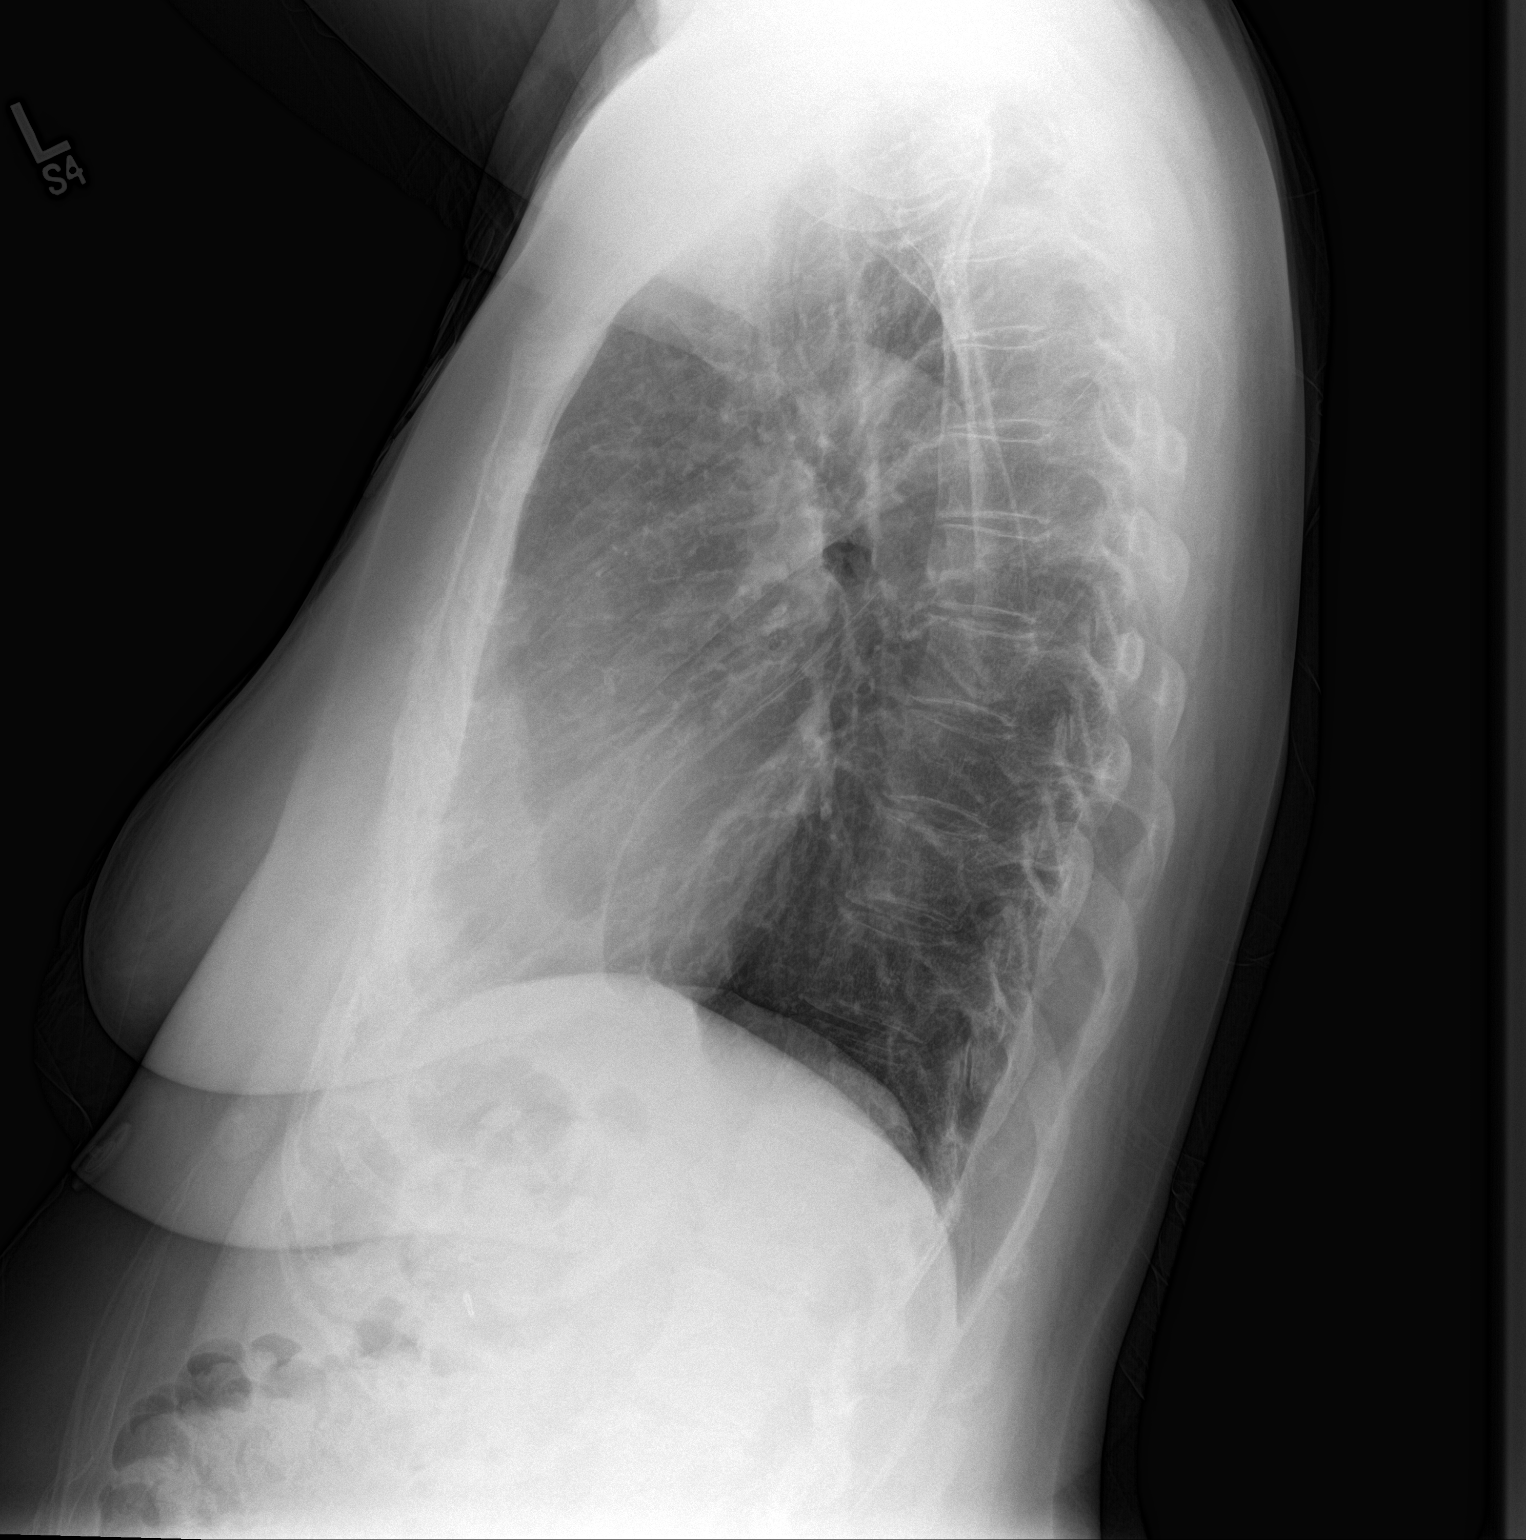

[2 of 2 positions shown; findings below may reference images not displayed]

FINDINGS: Normal heart size and vascularity. Mild hyperinflation and slight
apical pleural parenchymal scarring. No focal pneumonia, collapse or
consolidation. Negative for edema, effusion, or pneumothorax.
Trachea midline. Prior cholecystectomy evident. No acute osseous
finding.
IMPRESSION: Mild hyperinflation.  No acute chest process.

## 2017-07-02 ENCOUNTER — Encounter: Payer: Self-pay | Admitting: *Deleted

## 2017-07-17 ENCOUNTER — Ambulatory Visit: Payer: Medicare Other | Admitting: Interventional Cardiology

## 2017-08-29 NOTE — Progress Notes (Signed)
Cardiology Office Note    Date:  08/30/2017   ID:  Kristy, Fuller [birth year], MRN [id number]  PCP:  Lollie Sails, MD  Cardiologist: Sinclair Grooms, MD   Chief Complaint  Patient presents with  . Palpitations  . Leg Pain    History of Present Illness:  Kristy Fuller is a 71 y.o. female retired [employer], [employer] referred for cardiac evaluation of palpitations by Dr. Brooke Bonito. Patient has a prior history of pericardial effusion 2014.  The patient is concerned by 3 issues. The first is palpitations that she notices at night when she tries to follow sleep. It feels as though her heart is skipping a beat and racing.. She ambulates and goes about typical activities during the day without difficulty. She denies associated dyspnea, syncope, dizziness, and edema.  She has right medial thigh numbness and pain when she lies down at night. If she changes position or gets out of bed for the dysesthesia goes away. She is able to walk without leg pain or tightness.  She has a focal area in the left mid axillary line in the 10th intercostal space that is uncomfortable when she lies down. There is no point tenderness. She denies back pain. The discomfort is not precipitated by physical activity.  Past Medical History:  Diagnosis Date  . Benign neoplasm of colon   . Diabetes mellitus   . Dysphagia, unspecified(787.20)   . Family history of bleeding disorder 03/17/2012  . Gallstones   . Hypertension   . Hypothyroidism   . Insomnia, unspecified   . Pain in limb   . Sciatica   . Thyroid disease     Past Surgical History:  Procedure Laterality Date  . CHOLECYSTECTOMY    . COLONOSCOPY    . TUBAL LIGATION      Current Medications: Outpatient Medications Prior to Visit  Medication Sig Dispense Refill  . aspirin 81 MG tablet Take 81 mg by mouth daily.    . Cholecalciferol (D-3-5) 5000 UNITS capsule Take 10,000 Units by mouth daily.    Marland Kitchen co-enzyme Q-10 30 MG capsule Take  200 mg by mouth daily.    Marland Kitchen losartan (COZAAR) 50 MG tablet Take 50 mg by mouth daily.    . progesterone (PROMETRIUM) 100 MG capsule Take 100 mg by mouth daily.    Marland Kitchen thyroid (ARMOUR) 90 MG tablet Take 90 mg by mouth daily.    Marland Kitchen VITAMIN A PO Take 1 tablet by mouth daily.    . vitamin B-12 (CYANOCOBALAMIN) 1000 MCG tablet Take 1,000 mcg by mouth daily.    Marland Kitchen VITAMIN K PO Take 1 tablet by mouth daily.    Marland Kitchen estradiol (CLIMARA - DOSED IN MG/24 HR) 0.0375 mg/24hr patch Place 0.0375 mg onto the skin once a week. Change twice a week on wendesday and sundays    . glucosamine-chondroitin 500-400 MG tablet Take 1 tablet by mouth 3 (three) times daily.    Javier Docker Oil 500 MG CAPS Take 500 mg by mouth daily.    Marland Kitchen MAGNESIUM PO Take 1 tablet by mouth 2 (two) times daily.    . metFORMIN (GLUCOPHAGE-XR) 750 MG 24 hr tablet Take 750 mg by mouth daily with breakfast.    . NON FORMULARY 400 mg daily. Tumeric    . ondansetron (ZOFRAN) 8 MG tablet Take 1 tablet (8 mg total) by mouth every 8 (eight) hours as needed for nausea or vomiting. 10 tablet 0  . OVER THE COUNTER MEDICATION  Take 1 tablet by mouth daily. Joint supplement    . ranitidine (ZANTAC) 150 MG tablet Take 1 tablet (150 mg total) by mouth at bedtime. 30 tablet 0   No facility-administered medications prior to visit.      Allergies:   Patient has no known allergies.   Social History   Social History  . Marital status: Married    Spouse name: N/A  . Number of children: N/A  . Years of education: N/A   Social History Main Topics  . Smoking status: Never Smoker  . Smokeless tobacco: Never Used  . Alcohol use No  . Drug use: No  . Sexual activity: Not Asked   Other Topics Concern  . None   Social History Narrative  . None     Family History:  The patient's family history is not on file.   ROS:   Please see the history of present illness.    She does not snore. She doesn't sleep well. She does have thyroid disease and is on Armour  Thyroid replacement by Dr. Jeanann Lewandowsky. Most recent TSH by her primary care physician was reasonable. She has a prior history of pericardial effusion in 2014. No recurrence with last echo being in 2016. She has no peripheral edema. No exertion related chest discomfort. She has never had syncope. No history of heart disease. Does not smoke or drink.  All other systems reviewed and are negative.   PHYSICAL EXAM:   VS:  BP 108/62 (BP Location: Left Arm)   Pulse 70   Ht 5\' 5"  (1.651 m)   Wt 195 lb (88.5 kg)   BMI 32.45 kg/m    GEN: Well nourished, well developed, in no acute distress . Moderate obesity. HEENT: normal  Neck: no JVD, carotid bruits, or masses Cardiac: RRR; no murmurs, rubs, or gallops,no edema  Respiratory:  clear to auscultation bilaterally, normal work of breathing GI: soft, nontender, nondistended, + BS MS: no deformity or atrophy  Skin: warm and dry, no rash Neuro:  Alert and Oriented x 3, Strength and sensation are intact Psych: euthymic mood, full affect  Wt Readings from Last 3 Encounters:  08/30/17 195 lb (88.5 kg)  11/04/15 195 lb (88.5 kg)  03/19/12 194 lb 12.8 oz (88.4 kg)      Studies/Labs Reviewed:   EKG:  EKG  Normal sinus rhythm with flattened T waves diffusely. Left atrial abnormality is noted. Leftward axis is noted. Poor R-wave progression. When compared to her prior tracing from November 2016, no change has occurred.  Recent Labs: No results found for requested labs within last 8760 hours.   Lipid Panel No results found for: CHOL, TRIG, HDL, CHOLHDL, VLDL, LDLCALC, LDLDIRECT  Additional studies/ records that were reviewed today include:   Echocardiogram 2016: Summary  1. Overall left ventricular ejection fraction is estimated at 60 to 65%.  2. Mild concentric left ventricular hypertrophy.  3. Normal left ventricular diastolic filling.  4. Trivial pericardial effusion.   ASSESSMENT:    1. Palpitation   2. Pericardial effusion   3.  Chest pain at rest   4. Right leg pain   5. Hyperlipidemia LDL goal <100      PLAN:  In order of problems listed above:  1. Palpitations are concerning and in the setting of prior pericardial effusion, atrial fibrillation needs to be excluded. Her complaint is that her heart races. We will order a 48-hour Holter. I warned her that we may need a longer period of  monitoring if we did not capture the arrhythmia. She states that the tachycardia has been occurring every night recently. 2. There is no clinical evidence of recurrent pericardial effusion. 3. The chest discomfort is very atypical, focal, and positional. This is noncardiac in origin. 4. The medial thigh discomfort that occurs when she lies down and is not related to blood flow. She has excellent pulses. There are no exertion related symptoms. The discomfort is likely neuropathic. 5. LDL target should be less than 70.  48-hour Holter and possibly longer monitoring if we are unable to identify the source of her palpitations/tachycardia.  Medication Adjustments/Labs and Tests Ordered: Current medicines are reviewed at length with the patient today.  Concerns regarding medicines are outlined above.  Medication changes, Labs and Tests ordered today are listed in the Patient Instructions below. Patient Instructions  Medication Instructions:  None  Labwork: None  Testing/Procedures: Your physician has recommended that you wear a 48 hour holter monitor. Holter monitors are medical devices that record the heart's electrical activity. Doctors most often use these monitors to diagnose arrhythmias. Arrhythmias are problems with the speed or rhythm of the heartbeat. The monitor is a small, portable device. You can wear one while you do your normal daily activities. This is usually used to diagnose what is causing palpitations/syncope (passing out).    Follow-Up: Your physician recommends that you schedule a follow-up appointment as needed  with Dr. Tamala Julian.      Any Other Special Instructions Will Be Listed Below (If Applicable).     If you need a refill on your cardiac medications before your next appointment, please call your pharmacy.      Signed, Sinclair Grooms, MD  08/30/2017 3:25 PM    [facility] [place], [place], [place]  [address] Phone: [phone]; Fax: [phone]

## 2017-08-30 ENCOUNTER — Ambulatory Visit (INDEPENDENT_AMBULATORY_CARE_PROVIDER_SITE_OTHER): Payer: Medicare Other | Admitting: Interventional Cardiology

## 2017-08-30 ENCOUNTER — Encounter: Payer: Self-pay | Admitting: Interventional Cardiology

## 2017-08-30 VITALS — BP 108/62 | HR 70 | Ht 65.0 in | Wt 195.0 lb

## 2017-08-30 DIAGNOSIS — R079 Chest pain, unspecified: Secondary | ICD-10-CM

## 2017-08-30 DIAGNOSIS — I3139 Other pericardial effusion (noninflammatory): Secondary | ICD-10-CM

## 2017-08-30 DIAGNOSIS — M79604 Pain in right leg: Secondary | ICD-10-CM | POA: Diagnosis not present

## 2017-08-30 DIAGNOSIS — I313 Pericardial effusion (noninflammatory): Secondary | ICD-10-CM | POA: Diagnosis not present

## 2017-08-30 DIAGNOSIS — R002 Palpitations: Secondary | ICD-10-CM | POA: Diagnosis not present

## 2017-08-30 DIAGNOSIS — E785 Hyperlipidemia, unspecified: Secondary | ICD-10-CM | POA: Diagnosis not present

## 2017-08-30 NOTE — Patient Instructions (Signed)
Medication Instructions:  None  Labwork: None  Testing/Procedures: Your physician has recommended that you wear a 48 hour holter monitor. Holter monitors are medical devices that record the heart's electrical activity. Doctors most often use these monitors to diagnose arrhythmias. Arrhythmias are problems with the speed or rhythm of the heartbeat. The monitor is a small, portable device. You can wear one while you do your normal daily activities. This is usually used to diagnose what is causing palpitations/syncope (passing out).    Follow-Up: Your physician recommends that you schedule a follow-up appointment as needed with Dr. Tamala Julian.      Any Other Special Instructions Will Be Listed Below (If Applicable).     If you need a refill on your cardiac medications before your next appointment, please call your pharmacy.

## 2017-09-13 ENCOUNTER — Ambulatory Visit (INDEPENDENT_AMBULATORY_CARE_PROVIDER_SITE_OTHER): Payer: Medicare Other

## 2017-09-13 DIAGNOSIS — R002 Palpitations: Secondary | ICD-10-CM

## 2018-07-10 LAB — TSH: TSH: 0.22 — AB (ref 0.41–5.90)

## 2018-07-10 LAB — VITAMIN B12: Vitamin B-12: >1500

## 2018-07-10 LAB — BASIC METABOLIC PANEL
BUN: 13 (ref 4–21)
Creatinine: 0.7 (ref 0.5–1.1)

## 2018-07-10 LAB — HEMOGLOBIN A1C: Hgb A1c MFr Bld: 6.1 — AB (ref 4.0–6.0)

## 2018-08-18 ENCOUNTER — Encounter: Payer: Self-pay | Admitting: "Endocrinology

## 2018-08-18 ENCOUNTER — Ambulatory Visit (INDEPENDENT_AMBULATORY_CARE_PROVIDER_SITE_OTHER): Payer: Medicare Other | Admitting: "Endocrinology

## 2018-08-18 VITALS — BP 126/80 | HR 64 | Ht 65.0 in | Wt 183.0 lb

## 2018-08-18 DIAGNOSIS — E559 Vitamin D deficiency, unspecified: Secondary | ICD-10-CM

## 2018-08-18 DIAGNOSIS — E1165 Type 2 diabetes mellitus with hyperglycemia: Secondary | ICD-10-CM

## 2018-08-18 DIAGNOSIS — E538 Deficiency of other specified B group vitamins: Secondary | ICD-10-CM

## 2018-08-18 DIAGNOSIS — E678 Other specified hyperalimentation: Secondary | ICD-10-CM

## 2018-08-18 DIAGNOSIS — E039 Hypothyroidism, unspecified: Secondary | ICD-10-CM

## 2018-08-18 MED ORDER — SYNTHROID 100 MCG PO TABS: 100.0000 ug | ORAL_TABLET | Freq: Every day | ORAL | 3 refills | Status: DC

## 2018-08-18 NOTE — Progress Notes (Signed)
Endocrinology Consult Note                                            08/19/2018, 8:42 AM   Subjective:    Patient ID: Kristy Fuller, female    DOB: [birth year], PCP Margo Common Jarvis Newcomer, MD   Past Medical History:  Diagnosis Date  . Benign neoplasm of colon   . Diabetes mellitus   . Dysphagia, unspecified(787.20)   . Family history of bleeding disorder 03/17/2012  . Gallstones   . Hypertension   . Hypothyroidism   . Insomnia, unspecified   . Pain in limb   . Sciatica   . Thyroid disease    Past Surgical History:  Procedure Laterality Date  . CHOLECYSTECTOMY    . COLONOSCOPY    . TUBAL LIGATION     Social History   Socioeconomic History  . Marital status: Married    Spouse name: Not on file  . Number of children: Not on file  . Years of education: Not on file  . Highest education level: Not on file  Occupational History  . Not on file  Social Needs  . Financial resource strain: Not on file  . Food insecurity:    Worry: Not on file    Inability: Not on file  . Transportation needs:    Medical: Not on file    Non-medical: Not on file  Tobacco Use  . Smoking status: Never Smoker  . Smokeless tobacco: Never Used  Substance and Sexual Activity  . Alcohol use: No  . Drug use: No  . Sexual activity: Not on file  Lifestyle  . Physical activity:    Days per week: Not on file    Minutes per session: Not on file  . Stress: Not on file  Relationships  . Social connections:    Talks on phone: Not on file    Gets together: Not on file    Attends religious service: Not on file    Active member of club or organization: Not on file    Attends meetings of clubs or organizations: Not on file    Relationship status: Not on file  Other Topics Concern  . Not on file  Social History Narrative  . Not on file   Outpatient Encounter Medications as of 08/18/2018  Medication Sig  . aspirin 81 MG tablet Take 81 mg by mouth daily.  . Cholecalciferol (D-3-5) 5000  UNITS capsule Take 5,000 Units by mouth every morning.  . IRON PO Take 1 tablet by mouth daily.  Marland Kitchen losartan (COZAAR) 50 MG tablet Take 50 mg by mouth daily.  Marland Kitchen MAGNESIUM PO Take by mouth daily.  . Melatonin 5 MG CAPS Take 5 mg by mouth at bedtime.  . metFORMIN (GLUCOPHAGE-XR) 500 MG 24 hr tablet Take 500 mg by mouth 2 (two) times daily.   . Omega-3 Fatty Acids (OMEGA 3 PO) Take 1,280 mg by mouth daily.  Marland Kitchen OVER THE COUNTER MEDICATION Take 1 capsule by mouth daily. 5-MTHF-ES  . OVER THE COUNTER MEDICATION Take 1 capsule by mouth daily. ChromeMate GTF 600  . OVER THE COUNTER MEDICATION Take 1 capsule by mouth at bedtime. HPA Axis (Adrenal Protect)  . OVER THE COUNTER MEDICATION Iodine qd  . VITAMIN A PO Take 1 tablet by mouth daily.  . vitamin E 200 UNIT  capsule Take by mouth daily.  Marland Kitchen VITAMIN K PO Take 1 tablet by mouth daily.  . [DISCONTINUED] IODINE STRONG EX Apply topically.  . [DISCONTINUED] Iodine Strong, Lugols, (IODINE STRONG PO) Take 125 mg by mouth daily.  . [DISCONTINUED] thyroid (ARMOUR) 90 MG tablet Take 90 mg by mouth daily.  . [DISCONTINUED] VITAMIN E PO Take 1 capsule by mouth daily.  Marland Kitchen SYNTHROID 100 MCG tablet Take 1 tablet (100 mcg total) by mouth daily before breakfast. This will replace her prescription for Armour  . [DISCONTINUED] amoxicillin-clavulanate (AUGMENTIN) 875-125 MG tablet Take 1 tablet by mouth 2 (two) times daily. (For 10 days)  . [DISCONTINUED] co-enzyme Q-10 30 MG capsule Take 200 mg by mouth daily.  . [DISCONTINUED] loratadine (CLARITIN) 10 MG tablet Take 10 mg by mouth daily.  . [DISCONTINUED] niacin 500 MG tablet Take 500 mg by mouth daily.  . [DISCONTINUED] OVER THE COUNTER MEDICATION Take 500 mg by mouth daily. SERRETIA  . [DISCONTINUED] progesterone (PROMETRIUM) 100 MG capsule Take 100 mg by mouth daily.  . [DISCONTINUED] VITAMIN A PO Take by mouth daily.  . [DISCONTINUED] vitamin B-12 (CYANOCOBALAMIN) 1000 MCG tablet Take 1,000 mcg by mouth daily.  .  [DISCONTINUED] zinc gluconate 50 MG tablet Take 50 mg by mouth daily.   No facility-administered encounter medications on file as of 08/18/2018.    ALLERGIES: No Known Allergies  VACCINATION STATUS: Immunization History  Administered Date(s) Administered  . Influenza Whole 09/30/2009    Diabetes  She presents for her initial diabetic visit. She has type 2 diabetes mellitus. Onset time: She was diagnosed at approximate age of 65 years. Her disease course has been stable. There are no hypoglycemic associated symptoms. Pertinent negatives for hypoglycemia include no confusion, headaches, pallor or seizures. Associated symptoms include fatigue. Pertinent negatives for diabetes include no chest pain, no polydipsia, no polyphagia and no polyuria. There are no hypoglycemic complications. Symptoms are stable. Risk factors for coronary artery disease include diabetes mellitus, family history, sedentary lifestyle, post-menopausal and hypertension. Current diabetic treatment includes oral agent (dual therapy). Her weight is fluctuating minimally. She is following a generally unhealthy diet. When asked about meal planning, she reported none. She has not had a previous visit with a dietitian. An ACE inhibitor/angiotensin II receptor blocker is being taken.  Thyroid Problem  Presents for initial visit. Onset time: She was diagnosed with hypothyroidism at approximate age of 62 years. Symptoms include fatigue, hair loss and weight gain. Patient reports no cold intolerance, diarrhea, heat intolerance or palpitations. The symptoms have been worsening. Past treatments include desiccates. The treatment provided no relief. Her past medical history is significant for diabetes and obesity. Risk factors include family history of goiter and family history of hypothyroidism.     Review of Systems  Constitutional: Positive for fatigue and weight gain. Negative for chills, fever and unexpected weight change.  HENT:  Negative for trouble swallowing and voice change.   Eyes: Negative for visual disturbance.  Respiratory: Negative for cough, shortness of breath and wheezing.   Cardiovascular: Negative for chest pain, palpitations and leg swelling.  Gastrointestinal: Negative for diarrhea, nausea and vomiting.  Endocrine: Negative for cold intolerance, heat intolerance, polydipsia, polyphagia and polyuria.  Musculoskeletal: Negative for arthralgias and myalgias.  Skin: Negative for color change, pallor, rash and wound.  Neurological: Negative for seizures and headaches.  Psychiatric/Behavioral: Negative for confusion and suicidal ideas.     Objective:    BP 126/80   Pulse 64   Ht 5\' 5"  (1.651  m)   Wt 183 lb (83 kg)   BMI 30.45 kg/m   Wt Readings from Last 3 Encounters:  08/18/18 183 lb (83 kg)  08/30/17 195 lb (88.5 kg)  11/04/15 195 lb (88.5 kg)    Physical Exam  Constitutional: She is oriented to person, place, and time. She appears well-developed.  HENT:  Head: Normocephalic and atraumatic.  Eyes: EOM are normal.  Neck: Normal range of motion. Neck supple. No tracheal deviation present. No thyromegaly present.  Cardiovascular: Normal rate and regular rhythm.  Pulmonary/Chest: Effort normal and breath sounds normal.  Abdominal: Soft. Bowel sounds are normal. There is no tenderness. There is no guarding.  Musculoskeletal: Normal range of motion. She exhibits no edema.  Neurological: She is alert and oriented to person, place, and time. She has normal reflexes. No cranial nerve deficit. Coordination normal.  Skin: Skin is warm and dry. No rash noted. No erythema. No pallor.  Psychiatric: She has a normal mood and affect. Judgment normal.    CMP     Component Value Date/Time   NA 139 11/04/2015 1453   K 3.9 11/04/2015 1453   CL 107 11/04/2015 1453   CO2 24 11/04/2015 1453   GLUCOSE 96 11/04/2015 1453   BUN 13 07/10/2018   CREATININE 0.7 07/10/2018   CREATININE 0.89 11/04/2015 1453    CALCIUM 9.3 11/04/2015 1453   PROT 7.7 05/08/2012 2051   ALBUMIN 4.0 05/08/2012 2051   AST 13 05/08/2012 2051   ALT 9 05/08/2012 2051   ALKPHOS 124 (H) 05/08/2012 2051   BILITOT 0.2 (L) 05/08/2012 2051   GFRNONAA >60 11/04/2015 1453   GFRAA >60 11/04/2015 1453     Diabetic Labs (most recent): Lab Results  Component Value Date   HGBA1C 6.1 (A) 07/10/2018   HGBA1C 6.4 02/16/2010   HGBA1C 7.2 (H) 03/14/2009      Lab Results  Component Value Date   TSH 0.22 (A) 07/10/2018   TSH 1.65 02/16/2010      Assessment & Plan:   1.  type 2 diabetes mellitus   -Ms. Kok has currently controlled asymptomatic type 2 DM since 72 years of age,  with most recent A1c of 8.1 %. Recent labs reviewed.  -She does not report gross complications of diabetes, however Ms. Dement remains at a high risk for more acute and chronic complications which include CAD, CVA, CKD, retinopathy, and neuropathy. These are all discussed in detail with the patient.  - I have counseled her on diet management and weight loss, by adopting a carbohydrate restricted/protein rich diet. -  Suggestion is made for her to avoid simple carbohydrates  from her diet including Cakes, Sweet Desserts / Pastries, Ice Cream, Soda (diet and regular), Sweet Tea, Candies, Chips, Cookies, Store Bought Juices, Alcohol in Excess of  1-2 drinks a day, Artificial Sweeteners, and "Sugar-free" Products. This will help patient to have stable blood glucose profile and potentially avoid unintended weight gain.  - I encouraged her to switch to  unprocessed or minimally processed complex starch and increased protein intake (animal or plant source), fruits, and vegetables.  - She is advised to stick to a routine mealtimes to eat 3 meals  a day and avoid unnecessary snacks ( to snack only to correct hypoglycemia).    - I have approached her with the following individualized plan to manage diabetes and patient agrees:   -Based on her current  A1c of 6.1%, she will not require insulin treatment at this time.  I advised her to continue metformin 750 mg ER once daily with breakfast.    - She  will be considered for incretin therapy as appropriate next visit. - Patient specific target  A1c;  LDL, HDL, Triglycerides, and  Waist Circumference were discussed in detail.  2)  Hypothyroidism  -The circumstance of her diagnosis is not available to review.  She denies history of radioactive iodine ablation nor thyroidectomy. -Per her history, she took various forms of thyroid hormone for the last 20 years with significant problem of regulating her thyroid function tests.  This is mainly due to the fact that she is on Armour Thyroid at least for the last 5 to 6 years.  This is an inherent problems with Armour compared to thyroxine. -Per her current body weight, she will require significantly higher thyroxine dose than the equivalent thyroxine she is getting in the form of 90 mg of Armour which is 57 mcg.  I had a long discussion with her about the fact that she will do better on Synthroid and she agrees to switch. -I discussed and initiated Synthroid 100 mcg p.o. every morning with plan to repeat thyroid function test in 8 to 10 weeks.   - We discussed about correct intake of levothyroxine, at fasting, with water, separated by at least 30 minutes from breakfast, and separated by more than 4 hours from calcium, iron, multivitamins, acid reflux medications (PPIs). -Patient is made aware of the fact that thyroid hormone replacement is needed for life, dose to be adjusted by periodic monitoring of thyroid function tests.  3) BP/HTN: Her blood pressure is controlled to target at 126/80.  She is advised to continue her current blood pressure medications including losartan 50 mg p.o. daily.    4) Lipids/HPL: No recent lipid panel to review.  She is on omega-3 fatty acids from over-the-counter.  She will be considered for fasting lipid panel on  subsequent visits.  5)  Weight/Diet: She has class I obesity with BMI of 30.8.  Exercise, and detailed carbohydrates information provided.   6) Hypervitaminosis -Her vitamin B12 level in her referral package was >1500.  She takes multiple vitamin supplements from over-the-counter and the recommendation of her integrative treatment medicine providers.  I advised her to discontinue vitamin B12 supplements until repeat measurement of vitamin B12 is done.  7) Vitamin D deficiency -She does not have recent 5 hydroxy vitamin D measurements, however she is taking 10,000 units of vitamin D3 daily.  I advised her to lower that to 5000 units daily until next measurement.     8) Chronic Care/Health Maintenance:  -She is on ARB  medications and  is encouraged to initiate and continue to follow up with Ophthalmology, Dentist,  Podiatrist at least yearly or according to recommendations, and advised to  stay away from smoking. I have recommended yearly flu vaccine and pneumonia vaccine at least every 5 years; moderate intensity exercise for up to 150 minutes weekly; and  sleep for at least 7 hours a day.  - I advised patient to maintain close follow up with @PCP @ for primary care needs.  - Time spent with the patient: 45 minutes, of which >50% was spent in obtaining information about her symptoms, reviewing her previous labs, evaluations, and treatments, counseling her about type 2 diabetes, hypothyroidism, hypertension, and developing developing  plans for long term treatment based on the latest recommendations.  Ms. Rudden participated in the discussions, expressed understanding, and voiced agreement with the above plans.  All  questions were answered to her satisfaction. She is encouraged to contact clinic should she have any questions or concerns prior to her return visit.   Follow up plan: Return in about 3 months (around 11/18/2018) for Follow up with Pre-visit Labs.   Glade Lloyd, MD [facility] [address] [place], [place] [address] Phone: [phone]  Fax: [phone]     08/19/2018, 8:42 AM  This note was partially dictated with voice recognition software. Similar sounding words can be transcribed inadequately or may not  be corrected upon review.

## 2018-08-18 NOTE — Patient Instructions (Signed)

## 2018-08-21 ENCOUNTER — Other Ambulatory Visit: Payer: Self-pay

## 2018-08-21 ENCOUNTER — Other Ambulatory Visit: Payer: Self-pay | Admitting: "Endocrinology

## 2018-08-21 MED ORDER — LEVOTHYROXINE SODIUM 100 MCG PO TABS: 100.0000 ug | ORAL_TABLET | Freq: Every day | ORAL | 3 refills | Status: DC

## 2018-08-27 ENCOUNTER — Other Ambulatory Visit: Payer: Self-pay

## 2018-08-27 MED ORDER — LEVOTHYROXINE SODIUM 100 MCG PO TABS: ORAL_TABLET | ORAL | 3 refills | Status: DC

## 2018-11-03 ENCOUNTER — Ambulatory Visit: Payer: Medicare Other | Admitting: "Endocrinology

## 2018-11-20 LAB — TSH: TSH: 2.8 (ref 0.41–5.90)

## 2018-11-20 LAB — LIPID PANEL
Cholesterol: 215 — AB (ref 0–200)
HDL: 62 (ref 35–70)
LDL Cholesterol: 134
Triglycerides: 93 (ref 40–160)

## 2018-11-20 LAB — BASIC METABOLIC PANEL
BUN: 13 (ref 4–21)
Creatinine: 0.7 (ref 0.5–1.1)
Potassium: 4.3 (ref 3.4–5.3)
Sodium: 144 (ref 137–147)

## 2018-11-20 LAB — VITAMIN D 25 HYDROXY (VIT D DEFICIENCY, FRACTURES): Vit D, 25-Hydroxy: 76

## 2018-11-25 ENCOUNTER — Encounter: Payer: Self-pay | Admitting: "Endocrinology

## 2018-11-25 ENCOUNTER — Ambulatory Visit (INDEPENDENT_AMBULATORY_CARE_PROVIDER_SITE_OTHER): Payer: Medicare Other | Admitting: "Endocrinology

## 2018-11-25 VITALS — BP 126/74 | HR 71 | Ht 65.0 in | Wt 183.0 lb

## 2018-11-25 DIAGNOSIS — E1165 Type 2 diabetes mellitus with hyperglycemia: Secondary | ICD-10-CM | POA: Diagnosis not present

## 2018-11-25 DIAGNOSIS — E538 Deficiency of other specified B group vitamins: Secondary | ICD-10-CM | POA: Diagnosis not present

## 2018-11-25 DIAGNOSIS — E039 Hypothyroidism, unspecified: Secondary | ICD-10-CM

## 2018-11-25 MED ORDER — LEVOTHYROXINE SODIUM 100 MCG PO TABS: ORAL_TABLET | ORAL | 3 refills | Status: DC

## 2018-11-25 NOTE — Progress Notes (Signed)
Endocrinology follow-up  Note                                            11/25/2018, 5:14 PM   Subjective:    Patient ID: Kristy Fuller, female    DOB: May 26, 1946, PCP Margo Common Jarvis Newcomer, MD   Past Medical History:  Diagnosis Date  . Benign neoplasm of colon   . Diabetes mellitus   . Dysphagia, unspecified(787.20)   . Family history of bleeding disorder 03/17/2012  . Gallstones   . Hypertension   . Hypothyroidism   . Insomnia, unspecified   . Pain in limb   . Sciatica   . Thyroid disease    Past Surgical History:  Procedure Laterality Date  . CHOLECYSTECTOMY    . COLONOSCOPY    . TUBAL LIGATION     Social History   Socioeconomic History  . Marital status: Married    Spouse name: Not on file  . Number of children: Not on file  . Years of education: Not on file  . Highest education level: Not on file  Occupational History  . Not on file  Social Needs  . Financial resource strain: Not on file  . Food insecurity:    Worry: Not on file    Inability: Not on file  . Transportation needs:    Medical: Not on file    Non-medical: Not on file  Tobacco Use  . Smoking status: Never Smoker  . Smokeless tobacco: Never Used  Substance and Sexual Activity  . Alcohol use: No  . Drug use: No  . Sexual activity: Not on file  Lifestyle  . Physical activity:    Days per week: Not on file    Minutes per session: Not on file  . Stress: Not on file  Relationships  . Social connections:    Talks on phone: Not on file    Gets together: Not on file    Attends religious service: Not on file    Active member of club or organization: Not on file    Attends meetings of clubs or organizations: Not on file    Relationship status: Not on file  Other Topics Concern  . Not on file  Social History Narrative  . Not on file   Outpatient Encounter Medications as of 11/25/2018  Medication Sig  . aspirin 81 MG tablet Take 81 mg by mouth daily.  . IRON PO Take 1 tablet by  mouth daily.  Marland Kitchen levothyroxine (SYNTHROID, LEVOTHROID) 100 MCG tablet TAKE 1 TABLET(100 MCG) BY MOUTH DAILY BEFORE BREAKFAST  . losartan (COZAAR) 50 MG tablet Take 50 mg by mouth daily.  Marland Kitchen MAGNESIUM PO Take by mouth daily.  . Melatonin 5 MG CAPS Take 5 mg by mouth at bedtime.  . metFORMIN (GLUCOPHAGE-XR) 500 MG 24 hr tablet Take 500 mg by mouth 2 (two) times daily.   . Omega-3 Fatty Acids (OMEGA 3 PO) Take 1,280 mg by mouth daily.  Marland Kitchen OVER THE COUNTER MEDICATION Take 1 capsule by mouth daily. 5-MTHF-ES  . OVER THE COUNTER MEDICATION Take 1 capsule by mouth daily. ChromeMate GTF 600  . VITAMIN A PO Take 1 tablet by mouth daily.  . vitamin E 200 UNIT capsule Take by mouth daily.  Marland Kitchen VITAMIN K PO Take 1 tablet by mouth daily.  . [DISCONTINUED] Cholecalciferol (D-3-5)  5000 UNITS capsule Take 5,000 Units by mouth every morning.  . [DISCONTINUED] levothyroxine (SYNTHROID, LEVOTHROID) 100 MCG tablet TAKE 1 TABLET(100 MCG) BY MOUTH DAILY BEFORE BREAKFAST  . [DISCONTINUED] OVER THE COUNTER MEDICATION Take 1 capsule by mouth at bedtime. HPA Axis (Adrenal Protect)  . [DISCONTINUED] OVER THE COUNTER MEDICATION Iodine qd   No facility-administered encounter medications on file as of 11/25/2018.    ALLERGIES: No Known Allergies  VACCINATION STATUS: Immunization History  Administered Date(s) Administered  . Influenza Whole 09/30/2009    Diabetes  She presents for her follow-up diabetic visit. She has type 2 diabetes mellitus. Onset time: She was diagnosed at approximate age of 72 years. Her disease course has been stable. There are no hypoglycemic associated symptoms. Pertinent negatives for hypoglycemia include no confusion, headaches, pallor or seizures. Pertinent negatives for diabetes include no chest pain, no fatigue, no polydipsia, no polyphagia and no polyuria. There are no hypoglycemic complications. Symptoms are stable. Risk factors for coronary artery disease include diabetes mellitus, family  history, sedentary lifestyle, post-menopausal and hypertension. Current diabetic treatment includes oral agent (dual therapy). Her weight is fluctuating minimally. She is following a generally unhealthy diet. When asked about meal planning, she reported none. She has not had a previous visit with a dietitian. An ACE inhibitor/angiotensin II receptor blocker is being taken.  Thyroid Problem  Presents for initial visit. Onset time: She was diagnosed with hypothyroidism at approximate age of 72 years. Symptoms include hair loss and weight gain. Patient reports no cold intolerance, diarrhea, fatigue, heat intolerance or palpitations. The symptoms have been worsening. Past treatments include desiccates. The treatment provided no relief. Her past medical history is significant for diabetes and obesity. Risk factors include family history of goiter and family history of hypothyroidism.     Review of Systems  Constitutional: Positive for weight gain. Negative for chills, fatigue, fever and unexpected weight change.  HENT: Negative for trouble swallowing and voice change.   Eyes: Negative for visual disturbance.  Respiratory: Negative for cough, shortness of breath and wheezing.   Cardiovascular: Negative for chest pain, palpitations and leg swelling.  Gastrointestinal: Negative for diarrhea, nausea and vomiting.  Endocrine: Negative for cold intolerance, heat intolerance, polydipsia, polyphagia and polyuria.  Musculoskeletal: Negative for arthralgias and myalgias.  Skin: Negative for color change, pallor, rash and wound.  Neurological: Negative for seizures and headaches.  Psychiatric/Behavioral: Negative for confusion and suicidal ideas.    Objective:    BP 126/74   Pulse 71   Ht 5\' 5"  (1.651 m)   Wt 183 lb (83 kg)   BMI 30.45 kg/m   Wt Readings from Last 3 Encounters:  11/25/18 183 lb (83 kg)  08/18/18 183 lb (83 kg)  08/30/17 195 lb (88.5 kg)    Physical Exam  Constitutional: She is  oriented to person, place, and time. She appears well-developed.  HENT:  Head: Normocephalic and atraumatic.  Eyes: EOM are normal.  Neck: Normal range of motion. Neck supple. No tracheal deviation present. No thyromegaly present.  Cardiovascular: Normal rate and regular rhythm.  Pulmonary/Chest: Effort normal and breath sounds normal.  Abdominal: Soft. Bowel sounds are normal. There is no tenderness. There is no guarding.  Musculoskeletal: Normal range of motion. She exhibits no edema.  Neurological: She is alert and oriented to person, place, and time. She has normal reflexes. No cranial nerve deficit. Coordination normal.  Skin: Skin is warm and dry. No rash noted. No erythema. No pallor.  Psychiatric: She has a normal  mood and affect. Judgment normal.    CMP     Component Value Date/Time   NA 144 11/20/2018   K 4.3 11/20/2018   CL 107 11/04/2015 1453   CO2 24 11/04/2015 1453   GLUCOSE 96 11/04/2015 1453   BUN 13 11/20/2018   CREATININE 0.7 11/20/2018   CREATININE 0.89 11/04/2015 1453   CALCIUM 9.3 11/04/2015 1453   PROT 7.7 05/08/2012 2051   ALBUMIN 4.0 05/08/2012 2051   AST 13 05/08/2012 2051   ALT 9 05/08/2012 2051   ALKPHOS 124 (H) 05/08/2012 2051   BILITOT 0.2 (L) 05/08/2012 2051   GFRNONAA >60 11/04/2015 1453   GFRAA >60 11/04/2015 1453     Diabetic Labs (most recent): Lab Results  Component Value Date   HGBA1C 6.1 (A) 07/10/2018   HGBA1C 6.4 02/16/2010   HGBA1C 7.2 (H) 03/14/2009      Lab Results  Component Value Date   TSH 2.80 11/20/2018   TSH 0.22 (A) 07/10/2018   TSH 1.65 02/16/2010      Assessment & Plan:   1.  type 2 diabetes mellitus   -Ms. Wheelwright has currently controlled asymptomatic type 2 DM since 72 years of age,  with most recent A1c of 6.1 %. Recent labs reviewed.  -She does not report gross complications of diabetes, however Ms. Tomlin remains at a high risk for more acute and chronic complications which include CAD, CVA, CKD,  retinopathy, and neuropathy. These are all discussed in detail with the patient.  - I have counseled her on diet management and weight loss, by adopting a carbohydrate restricted/protein rich diet.  -  Suggestion is made for her to avoid simple carbohydrates  from her diet including Cakes, Sweet Desserts / Pastries, Ice Cream, Soda (diet and regular), Sweet Tea, Candies, Chips, Cookies, Store Bought Juices, Alcohol in Excess of  1-2 drinks a day, Artificial Sweeteners, and "Sugar-free" Products. This will help patient to have stable blood glucose profile and potentially avoid unintended weight gain.   - I encouraged her to switch to  unprocessed or minimally processed complex starch and increased protein intake (animal or plant source), fruits, and vegetables.  - She is advised to stick to a routine mealtimes to eat 3 meals  a day and avoid unnecessary snacks ( to snack only to correct hypoglycemia).    - I have approached her with the following individualized plan to manage diabetes and patient agrees:   -Based on her current A1c of 6.1%, she will not require insulin treatment at this time.    -She will continue on her current dose of metformin 750 mg ER once daily with breakfast.  After she finishes her current supplies, she will continue with metformin 500 mg ER twice a day.    - She  will be considered for incretin therapy as appropriate next visit. - Patient specific target  A1c;  LDL, HDL, Triglycerides, and  Waist Circumference were discussed in detail.  2)  Hypothyroidism  -She has done very well on levothyroxine.  Her previsit thyroid function tests are insistent with appropriate replacement.  I discussed and continued her on levothyroxine 100 mcg p.o. every morning.  - We discussed about correct intake of levothyroxine, at fasting, with water, separated by at least 30 minutes from breakfast, and separated by more than 4 hours from calcium, iron, multivitamins, acid reflux  medications (PPIs). -Patient is made aware of the fact that thyroid hormone replacement is needed for life, dose to be adjusted by  periodic monitoring of thyroid function tests.   3) BP/HTN: Her blood pressure is controlled to target at 126/80.  She is advised to continue her current blood pressure medications including losartan 50 mg p.o. daily.    4) Lipids/HPL: Her recent lipid panel showed LDL high at 134.  She is approached for statin therapy, however she declined this offer for unclear reasons.  The potential risks of untreated hyper lipidemia is discussed with her.  She is on omega-3 fatty acids from over-the-counter.   5)  Weight/Diet: She has class I obesity with BMI of 30.8.  Exercise, and detailed carbohydrates information provided.   6) Hypervitaminosis -Prior to her last visit, her vitamin B12 level in her referral package was >1500.  She has not taken vitamin B12 since last visit, will have repeat labs before her next visit.    She takes multiple vitamin supplements from over-the-counter and the recommendation of her integrative treatment medicine providers.   7) Vitamin D deficiency -Her recent 25 hydroxy vitamin D level is also high at 76.  She is advised to finish her current supply of vitamin D 5000 units daily and discontinue until next measurement.     8) Chronic Care/Health Maintenance:  -She is on ARB  medications and  is encouraged to initiate and continue to follow up with Ophthalmology, Dentist,  Podiatrist at least yearly or according to recommendations, and advised to  stay away from smoking. I have recommended yearly flu vaccine and pneumonia vaccine at least every 5 years; moderate intensity exercise for up to 150 minutes weekly; and  sleep for at least 7 hours a day.  - I advised patient to maintain close follow up with @PCP @ for primary care needs. - Time spent with the patient: 25 min, of which >50% was spent in reviewing her  current and  previous labs,  previous treatments, and medications doses and developing a plan for long-term care.  Colin Broach participated in the discussions, expressed understanding, and voiced agreement with the above plans.  All questions were answered to her satisfaction. she is encouraged to contact clinic should she have any questions or concerns prior to her return visit.  Follow up plan: Return in about 4 months (around 03/26/2019) for Follow up with Pre-visit Labs.   Glade Lloyd, MD [facility] [address] [place], [place] [address] Phone: [phone]  Fax: [phone]     11/25/2018, 5:14 PM  This note was partially dictated with voice recognition software. Similar sounding words can be transcribed inadequately or may not  be corrected upon review.

## 2019-08-30 ENCOUNTER — Other Ambulatory Visit: Payer: Self-pay | Admitting: "Endocrinology

## 2019-11-27 ENCOUNTER — Other Ambulatory Visit: Payer: Self-pay | Admitting: "Endocrinology

## 2019-12-10 ENCOUNTER — Other Ambulatory Visit: Payer: Self-pay | Admitting: "Endocrinology

## 2022-05-24 ENCOUNTER — Emergency Department: Payer: Medicare Other

## 2022-05-24 ENCOUNTER — Emergency Department
Admission: EM | Admit: 2022-05-24 | Discharge: 2022-05-24 | Disposition: A | Discharge status: Home / Self Care | Payer: Medicare Other | Attending: Emergency Medicine | Admitting: Emergency Medicine

## 2022-05-24 DIAGNOSIS — R519 Headache, unspecified: Secondary | ICD-10-CM | POA: Insufficient documentation

## 2022-05-24 DIAGNOSIS — Z634 Disappearance and death of family member: Secondary | ICD-10-CM | POA: Insufficient documentation

## 2022-05-24 HISTORY — DX: Type 2 diabetes mellitus without complications: E11.9

## 2022-05-24 HISTORY — DX: Essential (primary) hypertension: I10

## 2022-05-24 HISTORY — DX: Disorder of thyroid, unspecified: E07.9

## 2022-05-24 LAB — CBC AND DIFFERENTIAL
Absolute NRBC: 0 10*3/uL (ref 0.00–0.00)
Basophils Absolute Automated: 0.03 10*3/uL (ref 0.00–0.08)
Basophils Automated: 0.5 %
Eosinophils Absolute Automated: 0.15 10*3/uL (ref 0.00–0.44)
Eosinophils Automated: 2.3 %
Hematocrit: 37.5 % (ref 34.7–43.7)
Hgb: 12.2 g/dL (ref 11.4–14.8)
Immature Granulocytes Absolute: 0.01 10*3/uL (ref 0.00–0.07)
Immature Granulocytes: 0.2 %
Instrument Absolute Neutrophil Count: 3.97 10*3/uL (ref 1.10–6.33)
Lymphocytes Absolute Automated: 1.78 10*3/uL (ref 0.42–3.22)
Lymphocytes Automated: 27.7 %
MCH: 29.5 pg (ref 25.1–33.5)
MCHC: 32.5 g/dL (ref 31.5–35.8)
MCV: 90.6 fL (ref 78.0–96.0)
MPV: 10.7 fL (ref 8.9–12.5)
Monocytes Absolute Automated: 0.49 10*3/uL (ref 0.21–0.85)
Monocytes: 7.6 %
Neutrophils Absolute: 3.97 10*3/uL (ref 1.10–6.33)
Neutrophils: 61.7 %
Nucleated RBC: 0 /100 WBC (ref 0.0–0.0)
Platelets: 244 10*3/uL (ref 142–346)
RBC: 4.14 10*6/uL (ref 3.90–5.10)
RDW: 15 % (ref 11–15)
WBC: 6.43 10*3/uL (ref 3.10–9.50)

## 2022-05-24 LAB — COMPREHENSIVE METABOLIC PANEL
ALT: 12 U/L (ref 0–55)
AST (SGOT): 15 U/L (ref 5–41)
Albumin/Globulin Ratio: 1 (ref 0.9–2.2)
Albumin: 3.8 g/dL (ref 3.5–5.0)
Alkaline Phosphatase: 113 U/L (ref 37–117)
Anion Gap: 9 (ref 5.0–15.0)
BUN: 13 mg/dL (ref 7.0–21.0)
Bilirubin, Total: 0.3 mg/dL (ref 0.2–1.2)
CO2: 26 mEq/L (ref 17–29)
Calcium: 9.4 mg/dL (ref 7.9–10.2)
Chloride: 106 mEq/L (ref 99–111)
Creatinine: 0.9 mg/dL (ref 0.4–1.0)
Globulin: 3.7 g/dL — ABNORMAL HIGH (ref 2.0–3.6)
Glucose: 139 mg/dL — ABNORMAL HIGH (ref 70–100)
Potassium: 4 mEq/L (ref 3.5–5.3)
Protein, Total: 7.5 g/dL (ref 6.0–8.3)
Sodium: 141 mEq/L (ref 135–145)
eGFR: >60 mL/min/{1.73_m2} (ref >=60)

## 2022-05-24 MED ORDER — KETOROLAC TROMETHAMINE 30 MG/ML IJ SOLN
15.0000 mg | Freq: Once | INTRAMUSCULAR | Status: AC
Start: 2022-05-24 — End: 2022-05-24
  Administered 2022-05-24: 15 mg via INTRAVENOUS
  Filled 2022-05-24: qty 1

## 2022-05-24 MED ORDER — SODIUM CHLORIDE 0.9 % IV BOLUS
1000.0000 mL | Freq: Once | INTRAVENOUS | Status: AC
Start: 2022-05-24 — End: 2022-05-24
  Administered 2022-05-24: 1000 mL via INTRAVENOUS

## 2022-05-24 MED ORDER — CLONAZEPAM 0.5 MG PO TABS
0.5000 mg | ORAL_TABLET | Freq: Two times a day (BID) | ORAL | 0 refills | Status: AC | PRN
Start: 2022-05-24 — End: 2022-06-03

## 2022-05-24 NOTE — ED Triage Notes (Signed)
Gloria Maldonado is a 76 y.o. female present to the ER c/o R side headache and numbness to temple, onset last Tuesday after the passing of her husband, pt denies other symptoms state she have not had enough sleep. BP 144/83   Pulse 94   Temp 97.7 F (36.5 C) (Oral)   Resp 18   Wt 89.8 kg   SpO2 98%

## 2022-05-24 NOTE — ED Triage Notes (Signed)
[facility] EMERGENCY DEPARTMENT  Provider in Triage Note        Patient Name: Gloria Maldonado    Chief Complaint:   Chief Complaint   Patient presents with    Headache       HPI: Gloria Maldonado is a 76 y.o. female, who has had a rapid medical screening evaluation initiated by myself. Presents today with right sided headache and intermittent tingling to right temple for the past week. Reports her husband past away one week ago and headache began that evening. Denies trauma to head. Denies change in speech, vision, or new sensation/weakness. Reports she was started on Wellbutrin this past week due to her husband passing. Reports being a diabetic and not checking blood sugars.    Medical/Surgical/Social history: as per HPI    Vitals: BP 144/83   Pulse 94   Temp 97.7 F (36.5 C) (Oral)   Resp 18   Wt 89.8 kg   SpO2 98%     Pertinent brief exam:   Examination of area of concern: Ambulatory. No extra work of breathing. Equal chest rise and fall. Calm and cooperative during triage. NIHSS: 0.    Preliminary orders: Labs. Fluids. Med.    Symptom based preliminary diagnosis/MDM: Headache    Patient advised to remain in the ED until further evaluation can be performed. Patient instructed to notify staff of any changes in condition while waiting.  This assessment is an initial evaluation to expedite care.

## 2022-05-24 NOTE — ED Provider Notes (Signed)
EMERGENCY DEPARTMENT HISTORY AND PHYSICAL EXAM    PPE: Mask, face shield and gloves were worn every time I entered the room.     History of Presenting Illness:  History Provided By: Patient and daughter    Gloria Maldonado is a 76 y.o. female pw right-sided headache.  Patient reports headache started approximately 10 days ago.      Headache is located right temporal and radiating like a band on the right side.  There is a patch of numbness to the right temporal area.  Daughter reports possibly some difficulty with speech though patient attributes to her teeth chattering.    Her husband was taken to the hospital at that time and passed away.   Had been married for 54 years.    Started on BuSpar by PMD.  Has been taking melatonin 4 mg to help her sleep though she has not really been sleeping much at all.    History of hypertension, well controlled (typically 120s/70s).  History of diabetes, well controlled on metformin.    Past Medical History:   Diagnosis Date    Diabetes mellitus     Disorder of thyroid     Hypertension       Social Determinants of Health     Tobacco Use: Low Risk  (05/24/2022)    Patient History     Smoking Tobacco Use: Never     Smokeless Tobacco Use: Never     Passive Exposure: Not on file   Alcohol Use: Not on file   Financial Resource Strain: Not on file   Food Insecurity: Not on file   Transportation Needs: Not on file   Physical Activity: Not on file   Stress: Not on file   Social Connections: Not on file   Intimate Partner Violence: Not on file   Depression: Not on file   Housing Stability: Not on file       Reviewed and confirmed past medical, surgical, family and social history as documented.    PCP: Pcp, None, MD  SPECIALISTS:    Physical Exam:  Vitals:    05/24/22 1928 05/24/22 1944 05/24/22 2100 05/24/22 2130   BP: 144/83 133/69 148/84    Pulse: 94 82 78    Resp: '18 16 16    '$ Temp: 97.7 F (36.5 C)   98 F (36.7 C)   TempSrc: Oral   Oral   SpO2: 98% 98% 99%    Weight:         Pulse  Oximetry Interpretation: Normal     Physical Exam   Constitutional: Patient is alert.  Well-nourished.  NAD  Head: Atraumatic.   Eyes: EOMI. PERRL  ENT:  MMM.   Neck:  FROM. No spinal tenderness. Neck supple.    Cardiovascular: Normal rate and regular rhythm.   Pulmonary/Chest: Effort normal and breath sounds normal. No respiratory distress.   Abdominal: Soft. There is no tenderness. Bowel sounds present and normal.    Musculoskeletal:  No lower extremity edema or tenderness.    Neurological: Patient is alert and oriented to person, place, and time.  CN II-XII intact. Sensation intact throughout to light touch.  Power 5/5 all extremities. Pronator negative. Gait normal  Skin: Skin is warm and dry.    Cardiac Monitor:  Rate: 70s  Rhythm:  Normal Sinus Rhythm     Labs: Yes.  Glu 139.  Hgb 12.2  All labs have been personally reviewed and interpreted by me.     Xrays: Yes. CXR -  no masses or infiltrate  Reviewed and interpreted by me, confirmed by radiology report.    CT/US/MRI: Yes. CTH - no ICH or mass.   Reviewed  by me, confirmed by radiology report.     Patient Update Notes:       Medication given in the ED (and reassessment):  improved w/meds listed below.   Medications   sodium chloride 0.9 % bolus 1,000 mL (0 mLs Intravenous Stopped 05/24/22 2056)   ketorolac (TORADOL) injection 15 mg (15 mg Intravenous Given 05/24/22 1957)     Provider Notes: Right-sided headache with localized patch of numbness.  Considered CVA but not in any reasonable distribution for an acute stroke.  Considered temporal arteritis, however not focally tender to palpation and no other claudication symptoms.      Possibly primary headache secondary to lack of sleep from recent passing of husband.    Prescribed (and considered but not prescribed):   Discharge Medication List as of 05/24/2022  9:22 PM      Clonazepam 0.5 mg PO bid prn anxiety.     Clinical Impression:   1. Right-sided headache    2. Bereavement        ED Disposition       ED  Disposition   Discharge    Condition   --    Date/Time   Thu May 24, 2022  9:22 PM    Comment   Gloria Maldonado discharge to home/self care.    Condition at disposition: Stable                 The following USACS CMT is used for risk management:     Patient's NIHSS score is 0 (use Decision Support tab, then EDDECISIONSUPPORT dot phrase).    I utilized an evidence-based risk rating tool (CMT) along with my training and experience to weigh the risk of discharge against the risks of further testing, imaging, or hospitalization. At this time I estimate the risks of additional testing, imaging, or hospitalization to be equal to or greater than the risk of discharge. I discussed my risk assessment with the patient and the patient consents to the risk of discharge as well as the risk of uncertainty in estimating outcomes.       I have performed and NIHSS exam, and it is 0 with no cerebellar findings. The chance of missing a stroke is less than 1%. MRI at this time would not benefit the patient. MRI is higher risk than performing a NIHSS, and MRI is not as sensitive for acute stroke. With an NIHSS of 0 and no cerebellar findings, acute interventions such as thrombolytics, angiogram, or new anticoagulation most likely have more risk than benefit. [id number]     CHART OWNERSHIP: Dr. Dorris Carnes is the primary emergency physician of record.    This note was generated by the Epic EMR system/ Dragon speech recognition and may contain inherent errors or omissions not intended by the user. Grammatical errors, random word insertions, deletions and pronoun errors  are occasional consequences of this technology due to software limitations. Not all errors are caught or corrected. If there are questions or concerns about the content of this note or information contained within the body of this dictation they should be addressed directly with the author for clarification     Donia Guiles, MD  05/27/22 1656

## 2022-08-29 ENCOUNTER — Ambulatory Visit: Payer: Medicare Other | Admitting: Obstetrics and Gynecology
